# Patient Record
Sex: Female | Born: 1955 | Race: White | Hispanic: No | Smoking: Never smoker
Health system: Southern US, Community
[De-identification: ages and names within clinical notes are randomized; demographics above are authoritative.]

## PROBLEM LIST (undated history)

## (undated) ENCOUNTER — Emergency Department (HOSPITAL_COMMUNITY): Disposition: A | Discharge status: Home / Self Care | Payer: Commercial Managed Care - PPO

## (undated) DIAGNOSIS — B009 Herpesviral infection, unspecified: Secondary | ICD-10-CM

## (undated) DIAGNOSIS — C801 Malignant (primary) neoplasm, unspecified: Secondary | ICD-10-CM

## (undated) DIAGNOSIS — N390 Urinary tract infection, site not specified: Secondary | ICD-10-CM

## (undated) DIAGNOSIS — K219 Gastro-esophageal reflux disease without esophagitis: Secondary | ICD-10-CM

## (undated) DIAGNOSIS — C50919 Malignant neoplasm of unspecified site of unspecified female breast: Secondary | ICD-10-CM

## (undated) HISTORY — PX: BREAST LUMPECTOMY: SHX2

## (undated) HISTORY — DX: Herpesviral infection, unspecified: B00.9

## (undated) HISTORY — DX: Urinary tract infection, site not specified: N39.0

## (undated) HISTORY — DX: Gastro-esophageal reflux disease without esophagitis: K21.9

---

## 2003-03-11 ENCOUNTER — Ambulatory Visit (HOSPITAL_COMMUNITY): Admission: RE | Admit: 2003-03-11 | Discharge: 2003-03-11 | Payer: Self-pay | Admitting: Internal Medicine

## 2005-03-01 ENCOUNTER — Ambulatory Visit: Payer: Self-pay | Admitting: Internal Medicine

## 2005-08-13 ENCOUNTER — Emergency Department (HOSPITAL_COMMUNITY): Admission: EM | Admit: 2005-08-13 | Discharge: 2005-08-13 | Payer: Self-pay | Admitting: Emergency Medicine

## 2006-06-24 ENCOUNTER — Ambulatory Visit: Payer: Self-pay | Admitting: Internal Medicine

## 2008-12-27 DIAGNOSIS — C50919 Malignant neoplasm of unspecified site of unspecified female breast: Secondary | ICD-10-CM

## 2008-12-27 HISTORY — DX: Malignant neoplasm of unspecified site of unspecified female breast: C50.919

## 2008-12-27 HISTORY — PX: BREAST LUMPECTOMY: SHX2

## 2008-12-27 HISTORY — PX: BREAST SURGERY: SHX581

## 2009-02-20 ENCOUNTER — Encounter: Admission: RE | Admit: 2009-02-20 | Discharge: 2009-02-20 | Payer: Self-pay | Admitting: Obstetrics & Gynecology

## 2009-02-20 ENCOUNTER — Encounter (INDEPENDENT_AMBULATORY_CARE_PROVIDER_SITE_OTHER): Payer: Self-pay | Admitting: Obstetrics & Gynecology

## 2009-02-28 ENCOUNTER — Encounter: Admission: RE | Admit: 2009-02-28 | Discharge: 2009-02-28 | Payer: Self-pay | Admitting: Obstetrics & Gynecology

## 2009-03-07 ENCOUNTER — Encounter (INDEPENDENT_AMBULATORY_CARE_PROVIDER_SITE_OTHER): Payer: Self-pay | Admitting: Surgery

## 2009-03-07 ENCOUNTER — Ambulatory Visit (HOSPITAL_COMMUNITY): Admission: RE | Admit: 2009-03-07 | Discharge: 2009-03-08 | Payer: Self-pay | Admitting: Surgery

## 2009-03-07 ENCOUNTER — Encounter: Admission: RE | Admit: 2009-03-07 | Discharge: 2009-03-07 | Payer: Self-pay | Admitting: Surgery

## 2009-04-03 ENCOUNTER — Ambulatory Visit: Admission: RE | Admit: 2009-04-03 | Discharge: 2009-06-04 | Payer: Self-pay | Admitting: Radiation Oncology

## 2009-07-27 HISTORY — PX: ABDOMINAL HYSTERECTOMY: SHX81

## 2009-08-19 ENCOUNTER — Encounter (INDEPENDENT_AMBULATORY_CARE_PROVIDER_SITE_OTHER): Payer: Self-pay | Admitting: Obstetrics and Gynecology

## 2009-08-19 ENCOUNTER — Inpatient Hospital Stay (HOSPITAL_COMMUNITY): Admission: RE | Admit: 2009-08-19 | Discharge: 2009-08-20 | Payer: Self-pay | Admitting: Obstetrics and Gynecology

## 2010-03-04 ENCOUNTER — Encounter: Admission: RE | Admit: 2010-03-04 | Discharge: 2010-03-04 | Payer: Self-pay | Admitting: Hematology and Oncology

## 2011-01-16 ENCOUNTER — Other Ambulatory Visit: Payer: Self-pay | Admitting: Hematology and Oncology

## 2011-01-16 DIAGNOSIS — Z9889 Other specified postprocedural states: Secondary | ICD-10-CM

## 2011-01-17 ENCOUNTER — Encounter: Payer: Self-pay | Admitting: Obstetrics & Gynecology

## 2011-03-18 ENCOUNTER — Ambulatory Visit
Admission: RE | Admit: 2011-03-18 | Discharge: 2011-03-18 | Disposition: A | Discharge status: Home / Self Care | Payer: 59 | Source: Ambulatory Visit | Attending: Hematology and Oncology | Admitting: Hematology and Oncology

## 2011-03-18 DIAGNOSIS — Z9889 Other specified postprocedural states: Secondary | ICD-10-CM

## 2011-04-03 LAB — COMPREHENSIVE METABOLIC PANEL
ALT: 10 U/L (ref 0–35)
AST: 18 U/L (ref 0–37)
Albumin: 3.8 g/dL (ref 3.5–5.2)
Alkaline Phosphatase: 44 U/L (ref 39–117)
CO2: 31 mEq/L (ref 19–32)
Chloride: 102 mEq/L (ref 96–112)
Creatinine, Ser: 0.53 mg/dL (ref 0.4–1.2)
GFR calc Af Amer: >60 mL/min (ref >60)
GFR calc non Af Amer: >60 mL/min (ref >60)
Potassium: 3.8 mEq/L (ref 3.5–5.1)
Total Bilirubin: 0.9 mg/dL (ref 0.3–1.2)

## 2011-04-03 LAB — CBC
HCT: 30.8 % — ABNORMAL LOW (ref 36.0–46.0)
MCHC: 34.7 g/dL (ref 30.0–36.0)
MCV: 92.2 fL (ref 78.0–100.0)
MCV: 92.5 fL (ref 78.0–100.0)
Platelets: 126 10*3/uL — ABNORMAL LOW (ref 150–400)
Platelets: 163 10*3/uL (ref 150–400)
RBC: 4.1 MIL/uL (ref 3.87–5.11)
RDW: 13 % (ref 11.5–15.5)
WBC: 4.3 10*3/uL (ref 4.0–10.5)
WBC: 6 10*3/uL (ref 4.0–10.5)

## 2011-04-03 LAB — BASIC METABOLIC PANEL
BUN: 8 mg/dL (ref 6–23)
CO2: 31 mEq/L (ref 19–32)
Chloride: 104 mEq/L (ref 96–112)
Creatinine, Ser: 0.56 mg/dL (ref 0.4–1.2)
Glucose, Bld: 103 mg/dL — ABNORMAL HIGH (ref 70–99)
Potassium: 3.9 mEq/L (ref 3.5–5.1)

## 2011-04-03 LAB — APTT: aPTT: 23 seconds — ABNORMAL LOW (ref 24–37)

## 2011-04-08 LAB — CANCER ANTIGEN 27.29: CA 27.29: 6 U/mL (ref 0–39)

## 2011-04-08 LAB — DIFFERENTIAL
Lymphs Abs: 2.2 10*3/uL (ref 0.7–4.0)
Monocytes Absolute: 0.4 10*3/uL (ref 0.1–1.0)
Monocytes Relative: 7 % (ref 3–12)
Neutro Abs: 2.5 10*3/uL (ref 1.7–7.7)
Neutrophils Relative %: 49 % (ref 43–77)

## 2011-04-08 LAB — COMPREHENSIVE METABOLIC PANEL
ALT: 15 U/L (ref 0–35)
Albumin: 3.9 g/dL (ref 3.5–5.2)
Calcium: 9.5 mg/dL (ref 8.4–10.5)
Glucose, Bld: 76 mg/dL (ref 70–99)
Potassium: 4.1 mEq/L (ref 3.5–5.1)
Sodium: 144 mEq/L (ref 135–145)
Total Protein: 7.1 g/dL (ref 6.0–8.3)

## 2011-04-08 LAB — CBC
Hemoglobin: 12.7 g/dL (ref 12.0–15.0)
MCHC: 35 g/dL (ref 30.0–36.0)
Platelets: 181 10*3/uL (ref 150–400)
RDW: 12.5 % (ref 11.5–15.5)

## 2011-05-11 NOTE — Op Note (Signed)
NAMETIFANNY, [name]             ACCOUNT NO.:  [id number]   MEDICAL RECORD NO.:  [id number]          PATIENT TYPE:  OIB   LOCATION:  5125                         FACILITY:  [facility]   PHYSICIAN:  Sandria Bales. Ezzard Standing, M.D.  DATE OF BIRTH:  1956-05-15   DATE OF PROCEDURE:  03/07/2009  DATE OF DISCHARGE:                               OPERATIVE REPORT   Date of Surgery ?   PREOPERATIVE DIAGNOSIS:  Right breast cancer at 11 o'clock position,  approximate 7-mm (T1a).   POSTOPERATIVE DIAGNOSES:  1. Right breast cancer, 11 o'clock position essentially in the right      axillary tail of Spence, approximately 7 mm in size.  2. Sentinel lymph node biopsy.   PROCEDURES:  Injection of methylene blue, right needle-localized  lumpectomy, right axillary node dissection with marking of sentinel  lymph node.   SURGEON:  Sandria Bales. Ezzard Standing, MD   ASSISTANT:  None.   ANESTHESIA:  General endotracheal.   ESTIMATED BLOOD LOSS:  50 cc.   DRAINS LEFT IN:  None.   INDICATION FOR PROCEDURE:  [name] is a 55 year old white female,  patient of Dr. Varney Baas, who had recent abnormalities found on  screening mammography, biopsy that showed an infiltrating ductal  carcinoma.  I discussed with her about proceeding with lumpectomy and  sentinel lymph node biopsy.  She now comes for attempt at this.   The potential complications include but are not limited to bleeding,  infection, nerve injury, and recurrence of the tumor as well as axillary  node dissection.  She understands and we tried to do a sentinel lymph  node first, if that is negative stop but if it is positive, possibly do  an axillary node dissection, and the risks of that include nerve injury  and lymphedema.   OPERATION:  The patient was placed in supine position.  A roll was placed in the  right arm with the right arm propped up some, andher right breast and  prepped with Betadine solution, and time-out held.   Her breast had been  injected with 1 mCi of sulfur colloid  preoperatively.  I injected about 2 cc of 40% methylene blue in the  subareolar portion.  I tried to find a sentinel node preoperatively but  never found anything by the Neoprobe.   After prepping out her breast, I then used the wire, which was coming  really lateral posteriorly, it was really in the low axilla, as the  posterior point.  I then excised the block of breast tissue  approximately 5 cm in diameter.  I went down to the chest wall.  I  thought I got well around the wire.   I sent this as a specimen mammogram, which confirmed that the wire, the  tumor, the clip were all in the specimen.  I used the ink kit to mark  the margins of this lumpectomy.   I then turned my attention to sentinel lymph node, but I really never  found any counts.  I went on and did a lower axillary dissection.  After  having the nodes about two-thirds  of the way down, I was able to find at  least 1 node that had some very faint counts, was faintly blue, but I  went on and did the axillary dissection then.  I identified the inferior  aspect of the axillary vein.  I found the long thoracic nerve along the  chest wall and the latissimus dorsi posteriorly with the thoracodorsal  nerve were spared this during the dissection.  There was this little wad  of nodes, again part of those nodes which was hot on the Neoprobe, it  had some faint bluish color.   I spoke with Dr. Nedra Hai about my findings, that basically this was probably  more of a lymph node dissection than a sentinel lymph node biopsy, but  since I had done this dissection, there is no reason for touch prep and  she will just put that through for permanent evaluation.   I then irrigated the wound.  Because of the wider dissection of the  axilla, I did leave a 19-French Blake drain in the wound.  I sewed this  in place with a 2-0 nylon suture.  I sewed subcutaneous tissue with a 3-  0 Vicryl suture, the skin with  a 5-0 Monocryl suture, and painted the  wound with tincture of benzoin, and steri-stripped it.   The patient tolerated the procedure well, and was transported to the  recovery room in good condition.  Sponge and needle counts were correct  at the end of case.       Sandria Bales. Ezzard Standing, M.D.  Electronically Signed     DHN/MEDQ  D:  03/07/2009  T:  03/08/2009  Job:  [id number]   cc:   Freddy Finner, M.D.  Dr. Winferd Humphrey

## 2011-05-11 NOTE — Op Note (Signed)
NAMEGRACIANA, SESSA NO.:  [id number]   MEDICAL RECORD NO.:  [id number]          PATIENT TYPE:  INP   LOCATION:  9312                          FACILITY:  [facility]   PHYSICIAN:  Randye Lobo, M.D.   DATE OF BIRTH:  [birth year]   DATE OF PROCEDURE:  08/19/2009  DATE OF DISCHARGE:                               OPERATIVE REPORT   PREOPERATIVE DIAGNOSES:  1. Incomplete uterovaginal prolapse.  2. Occult genuine stress incontinence.  3. 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   POSTOPERATIVE DIAGNOSES:  1. Incomplete uterovaginal prolapse.  2. Occult genuine stress incontinence.  3. 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   PROCEDURE:  A laparoscopically-assisted vaginal hysterectomy with  bilateral salpingo-oophorectomy, anterior and posterior colporrhaphy,  TVT, mid urethral sling, cystoscopy.   SURGEON:  Randye Lobo, MD   ASSISTANT:  Luvenia Redden, MD   ANESTHESIA:  General endotracheal, local with 0.5% lidocaine with  epinephrine 1:200,000 and local with 0.25% Marcaine.   IV FLUIDS:  1750 mL of Ringer lactate.   ESTIMATED BLOOD LOSS:  200 mL.   URINE OUTPUT:  600 mL.   COMPLICATIONS:  None.   INDICATIONS FOR THE PROCEDURE:  The patient is a 55 year old, para 2,  Caucasian female, status post bilateral tubal ligation who presented  with vaginal protrusion and a desire for surgical treatment.  During the  patient's evaluation, she underwent urodynamic testing with reduction of  her third-degree cystocele, at which time she was diagnosed with occult  genuine stress incontinence.  The patient has recently had a diagnosis  of adenocarcinoma of the right breast which is both estrogen and  progesterone receptor positive.  She had a lumpectomy with sentinel node  evaluation and radiation therapy, and she is currently on tamoxifen  treatment  under the direction of Dr. Isabel Caprice.  A plan is now made  to proceed with a laparoscopically-assisted vaginal hysterectomy with  bilateral salpingo-oophorectomy, anterior and posterior colporrhaphy  along with a mid urethral sling and cystoscopy.  Risks, benefits, and  alternatives have been discussed with the patient who wishes to proceed.   FINDINGS:  Examination under anesthesia revealed a third-degree  cystocele with first-degree uterine prolapse and a second-degree  rectocele.  At the time of laparoscopy, the uterus and ovaries were  within normal limits.  The fallopian tubes were consistent with a prior  bilateral tubal ligation.  The patient had several vessels in the  infundibulopelvic region and along the pelvic sidewalls which appeared  to be consistent with a diagnosis of pelvic congestion syndrome.  The  appendix was normal along with the liver.  There was no evidence of any  adhesive disease nor excrescences throughout the abdomen or pelvis.   SPECIMEN:  The uterus, tubes, and ovaries were sent to pathology.   PROCEDURE:  The patient was reidentified in the preoperative hold area.  She did receive cefotetan IV for antibiotic prophylaxis and she received  both PAS stockings and TED hose for DVT prophylaxis.  In the operating room, general endotracheal anesthesia was induced, and  the patient was placed in the dorsal lithotomy position.  The abdomen  and the vagina were sterilely prepped and draped.  A Foley catheter was  placed inside the bladder.  A speculum was placed inside the vagina, and  a single-tooth tenaculum was placed on the anterior cervical lip.  This  was replaced with a Hulka tenaculum, and the remaining vaginal  instruments were removed.   Attention was then turned to the abdomen where a 1-cm umbilical incision  was created sharply with a scalpel.  The dissection along the abdominal  wall was carried down to the fascia using an Allis clamp.  A 10-mm   trocar was then inserted directly into the peritoneal cavity without  difficulty.  The laparoscope confirmed proper placement.  A CO2  pneumoperitoneum was achieved, and the patient was then placed in the  Trendelenburg position.  A 5-mm incision was created in the left lower  quadrant, and a trocar placed under direct visualization of the  laparoscope.  An inspection of the pelvic and abdominal organs was  performed, and the findings are as noted above.  A 5-mm right lower  quadrant incision was then created and again a 5-mm trocar placed under  visualization of the laparoscope.   The procedure was begun by taking down some filmy adhesions of the  sigmoid colon over the right infundibulopelvic ligament.  The ureter was  then identified along the left pelvic sidewall.  The Gyrus instrument  was used to triply cauterize and then cut the infundibulopelvic  ligament.  Dissection was carried through the posterior leaf of the  broad ligament using again the Gyrus instrument for coagulation and  cutting.  The left round ligament was grasped and was similarly  cauterized and bisected.  Dissection continued through the anterior leaf  of the broad ligament again using the same instrument.  A branch of the  uterine artery superiorly was encountered during this dissection and  hemostasis was achieved with the Gyrus instrument.  The same procedure  that was performed on the patient's left-hand side was repeated on the  right-hand side after the right ureter was identified.  The dissection  on the right-hand side was not taken down to the level of any uterine  artery structures.  Hemostasis was good at this time, and the remainder  of the procedure was therefore performed vaginally.  The laparoscope and  laparoscopic instruments were removed.   The patient was then placed in the high lithotomy position.  A weighted  speculum was placed inside the vagina.  The cervix was grasped with  tenaculums  anteriorly and posteriorly.  The cervix was circumferentially  injected with 0.5% lidocaine with epinephrine 1:200,000.  The cervix was  circumscribed with the scalpel.  Entry into the anterior cul-de-sac was  performed sharply and digital exam confirmed this to be in the proper  location.  The posterior cul-de-sac was then entered sharply, and again  digital exam confirmed proper entry into this location.  A Deaver was  placed in the anterior cul-de-sac and a long weighted speculum in the  posterior cul-de-sac.   The uterosacral ligaments were then grasped with Heaney clamps, sharply  divided, and suture ligated with transfixing sutures of 0 Vicryl.  The  bladder pillars were then grasped with the Heaney clamp, sharply  divided, and then suture ligated with 0 Vicryl bilaterally as well.  Dissection then continued along the cardinal ligaments which  were  sequentially clamped, sharply divided, and suture ligated with 0 Vicryl.  This dissection was then carried to the top of the cardinal ligaments  using the same technique until the pedicles had been completely taken  all the way up to the previous cautery line.  The uterus, tubes, and  ovaries were removed at this time and sent to pathology.   Pedicles were examined and found to be hemostatic.  A short weighted  speculum was then placed inside the vagina and the posterior vaginal  cuff was then whip stitched with a running locked suture of 0 Vicryl.   A culdoplasty suture was next placed by going through the vagina at the  6 o'clock position and into the posterior cul-de-sac.  It was brought  through the distal left uterosacral ligament, across the posterior cul-  de-sac in a pursestring fashion, and then down through the distal right  uterosacral ligament before coming out through the cul-de-sac and into  the vagina again at the 6 o'clock position.   The anterior colporrhaphy was performed next.  Allis clamps were used to  mark the  midline of the anterior vaginal mucosa which was injected with  0.5% Lidocaine with epinephrine 1:200,000.  The anterior vaginal mucosa  was then bisected in the midline using a Metzenbaum scissors.  Sharp  dissection along with blunt dissection was used then to dissect the  bladder and subvaginal tissue from the overlying vaginal mucosa.  Thus,  the dissection was carried back to the level of the pubic rami and the  uterosacral ligaments inferiorly.   The sling was performed next.  It was performed in a top-down fashion.  A 5-mm incisions were created 2 cm to the right and left of the midline  suprapubically.  The abdominal needle passer was placed through the  right suprapubic incision and out through the endopelvic fascia at the  level of the mid urethra and lateral to this on the ipsilateral side.  The same procedure that was performed on the right was then repeated on  the left-hand side.  The Foley catheter was removed at this time and  cystoscopy was performed.  There was no evidence of a foreign body in  the bladder or the urethra.  The bladder was visualized throughout 360  degrees and had a normal bladder dome and trigone.  The ureters were  noted to be patent bilaterally.  The cystoscopic fluid was drained, and  the Foley catheter was replaced.  The sling was attached to the  abdominal needle passers and drawn up through the incisions  suprapubically.  The plastic was separated from the surrounding sling,  and a Kelly clamp was placed between the sling and the urethra as the  plastic sheaths were removed.  The sling was noted to be in good  position.  Excess sling was trimmed suprapubically.   The anterior colporrhaphy was performed next by placing vertical  mattress sutures of 2-0 Vicryl for reduction of the cystocele.  Excess  mucosa was then trimmed anteriorly and the anterior vaginal wall was  closed with a running locked suture of 2-0 Vicryl.  The vaginal cuff was   closed at this time with a running suture of 0 Vicryl.   The posterior colporrhaphy was performed next.  Allis clamps were used  to mark the posterior mucosa in the midline.  The perineal body was  noted to be quite short.  The vaginal mucosa was first incised  transversely and then vertically  in the midline with a Metzenbaum  scissors.  Sharp dissection was used to dissect the perirectal fascia  off the overlying vaginal mucosa.  This was performed to a level of  approximately 1 cm below the culdoplasty suture.  Hemostasis during the  dissection was performed with monopolar cautery.  The rectocele was  reduced by placing vertical mattress sutures of 0 Vicryl.  A vertical  mattress suture of 0 Vicryl was placed at the level of the perineal  body.  Excess mucosa was trimmed, and the posterior vaginal mucosa was  closed with a running locked suture of 2-0 Vicryl which continued along  the perineal body in a subcuticular fashion as for an episiotomy-type  repair.   The rectal exam was performed which confirmed the absence of sutures in  the rectum.  A plain gauze packing was placed inside the vagina.  The  Foley catheter was left to gravity drainage.  The Foley catheter had  been replaced after the cystoscopy had been performed.   The suprapubic incisions were closed with Dermabond.   Final laparoscopy was performed.  The pelvis was irrigated and  suctioned, and all pedicle sites and vaginal cuff were noted to be  hemostatic.  The 5-mm trocars were removed under visualization of the  laparoscope.  The CO2 pneumoperitoneum was released, and the umbilical  trocar and laparoscope were removed simultaneously.   The umbilical incision was closed with inverted subcuticular sutures of  4-0 Vicryl.  All of the abdominal incisions were then closed with  Dermabond.   This concluded the patient's procedure.  There were no complications.  All needle, instrument, and sponge counts were  correct.      Randye Lobo, M.D.  Electronically Signed     BES/MEDQ  D:  08/19/2009  T:  08/20/2009  Job:  [id number]   cc:   Lynett Fish, M.D.

## 2011-05-11 NOTE — H&P (Signed)
NAMESTEPHENI, [name] NO.:  [id number]   MEDICAL RECORD NO.:  [id number]          PATIENT TYPE:  AMB   LOCATION:  SDC                           FACILITY:  [facility]   PHYSICIAN:  Randye Lobo, M.D.   DATE OF BIRTH:  [birth year]   DATE OF ADMISSION:  DATE OF DISCHARGE:                              HISTORY & PHYSICAL   CHIEF COMPLAINT:  Vaginal prolapse.   HISTORY OF PRESENT ILLNESS:  The patient is a 55 year old gravida 2,  para 2 Caucasian female status post bilateral tubal ligation, who  presents with a vaginal bulge and a desire for surgical treatment.  The  patient has recently been diagnosed with adenocarcinoma of the right  breast and is status post lumpectomy with radiation therapy for an  estrogen receptor positive and progesterone receptor positive tumor.  The patient had no evidence of nodal disease.  She is currently on  tamoxifen therapy.   During the patient's evaluation in the office, she was noted to have  pelvic organ prolapse, and she underwent urodynamic testing with  reduction of her prolapse, which confirmed the presence of occult  genuine stress incontinence.  The patient reports the need for perineal  splinting to have bowel movements if she does not regularly use flax  seed oil.   The patient now desires surgical treatment of both her prolapse and her  incontinence.   PAST OBSTETRIC AND GYNECOLOGIC HISTORIES:  1. Status post vaginal delivery x2.  2. Status post bilateral tubal ligation in 1990.  3. Status post right breast lumpectomy with sentinel node procedure on      August 07, 2009.  4. Last Pap smear performed January 2010 was within normal limits.   PAST MEDICAL HISTORY:  1. Adenocarcinoma of the right breast, estrogen and progesterone      receptor positive.  Status post lumpectomy with sentinel node      procedure and radiation therapy.  Current tamoxifen treatment.  2. Gastroesophageal reflux disease.  The patient is currently  on      omeprazole.  3. History of urinary tract infection.  The patient reports that she      has undergone cystoscopy with a urologist in [place], and states that      this was normal.  4. Osteopenia.   SURGICAL HISTORY:  1. Status post bilateral tubal ligation.  2. Status post right breast lumpectomy with sentinel node procedure      March 07, 2009.  3. Status post knee surgery for cartilage removal 37 years ago.   CURRENT MEDICATIONS:  1. Tamoxifen 40 mg p.o. daily.  2. Omeprazole 40 mg p.o. daily.  3. Calcium.   ALLERGIES:  No known drug allergies.   SOCIAL HISTORY:  The patient is not married.  She has 2 children.  She  works as a Occupational hygienist at [employer].  She denies  use of tobacco, alcohol, or illicit drugs.   FAMILY HISTORY:  Positive for breast cancer in the patient's mother.   REVIEW OF SYSTEMS:  Please refer to the history of present illness.   PHYSICAL EXAMINATION:  VITAL SIGNS:  Height is 5 feet 7 inches, weight  148 pounds, blood pressure 110/82.  GENERAL:  The patient is a middle-aged Caucasian female in no acute  distress.  HEENT:  Normocephalic, atraumatic.  There is no evidence of thyromegaly  nor supraclavicular or cervical lymphadenopathy.  LUNGS:  Clear to auscultation bilaterally.  HEART:  S1, S2 with a regular rate and rhythm.  ABDOMEN:  Soft and nontender without evidence of hepatosplenomegaly or  organomegaly.  PELVIC:  Normal external genitalia and urethra.  The vagina and cervix  demonstrate no lesions.  There is a second to third degree cystocele,  first degree uterine prolapse, and a first to second degree rectocele.  On bimanual examination, the uterus is small and nontender with no  evidence of adnexal masses or tenderness.  RECTAL:  The anus is without lesions.   IMPRESSION:  The patient is a 55 year old para 2 Caucasian female with a  recent history of estrogen and progesterone receptor positive right  breast cancer.   The patient is currently on tamoxifen.  The patient has  both occult genuine stress incontinence and incomplete uterovaginal  prolapse.   PLAN:  The patient will undergo a laparoscopically assisted hysterectomy  with bilateral salpingo-oophorectomy and anterior and posterior  colporrhaphy, and tension-free vaginal tape, midurethral sling and  cystoscopy at the [facility] on August 19, 2009.  Risks, benefits, and alternatives have been discussed with the patient  who wishes to proceed.      Randye Lobo, M.D.  Electronically Signed     BES/MEDQ  D:  08/18/2009  T:  08/18/2009  Job:  [id number]

## 2011-05-14 NOTE — H&P (Signed)
NAME:  [name], [name] NO.:  [id number]   MEDICAL RECORD NO.:  [id number]                  PATIENT TYPE:   LOCATION:                                       FACILITY:   PHYSICIAN:  Lionel December, M.D.                 DATE OF BIRTH:  Apr 28, 1956   DATE OF ADMISSION:  DATE OF DISCHARGE:                                HISTORY & PHYSICAL   PRESENTING COMPLAINT:  Acid reflux and bloating.   HISTORY OF PRESENT ILLNESS:  [name] is a 55 year old Caucasian female patient  of Dr. Eliberto Ivory who was self-referred for GI evaluation.  She has been having  problems with intermittent burning in her throat, as well as chest pain.  Symptoms are gradually getting worse.  About two months ago she started  Prilosec OTC.  Initially she had a good result, but this only lasted for two  weeks.  Now she may be 50% better.  She also gives a history of choking on  drinking water.  She has changed her eating habits.  She also sleeps on  pillows, but has not had what she would consider satisfactory response.  She  also complains of epigastric pain and bloating with certain foods.  She has  had problems with constipation, but lately her bowels have been moving  daily.  She denies dysphagia with solids, hoarseness, chronic cough, melena,  or rectal bleeding.  She has had some nausea.  She says her appetite is  good, and her weight over the last six months has been stable.  However,  prior to that she lost 55 pounds over a period of two years, which she feels  was all voluntary weight loss.  She says Dr. Eliberto Ivory is a family physician,  but she has not seen him in a few years, but yet she is getting her pelvic  and Pap and mammography through Dr. Duanne Moron office.  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   MEDICATIONS:  1. She is on Prilosec OTC 20 mg q.a.m.,  2. M.V.I. daily.  3. Citracal+D daily.   She is also interested in  getting a bone density to make sure she does not  have osteoporosis.  Her mother had significant problems with osteoporosis.   PAST MEDICAL HISTORY:  Negative.  She has never been hospitalized, and has  not had any surgeries.   ALLERGIES:  No known allergies.   FAMILY HISTORY:  Father was diagnosed with lung carcinoma at age 57 and died  in less than a year.  Mother was diabetic and asthmatic.  She died at age  37.  She has three brothers in good health.   SOCIAL HISTORY:  She is a widow.  She has two sons.  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  She has  never smoked cigarettes, and does not drink alcohol.   PHYSICAL EXAMINATION:  GENERAL:  A pleasant, well-developed, thin Caucasian  female who in no acute distress.  VITAL SIGNS:  She weighs 136.5 pounds.  She is 5 feet 7 inches tall.  Pulse  60 per minute, blood pressure 110/70, temperature 97.6.  HEENT:  Conjunctivae are pink.  Sclerae is nonicteric.  Oropharyngeal mucosa  is normal.  Dentition is in satisfactory condition.  NECK:  Without masses or thyromegaly.  CARDIAC:  Regular rhythm.  Normal S1 and S2.  No murmur or gallop noted.  LUNGS:  Clear to auscultation.  ABDOMEN:  Flat.  It is soft with mild midepigastric tenderness.  No  organomegaly or masses noted.  RECTAL:  Deferred.  EXTREMITIES:  She does not have edema or clubbing.   LABORATORY DATA:  Provided by the patient performed on 10/31/02.  Her CBC is  normal.  Hemoglobin and hematocrit is 13.2 and 39.9.  TSH is 1.095.  Chemistry panel is normal except for bilirubin of 1.7, which was not  fractionated.  AST 16, ALT 12, total cholesterol 201, HDL 81, LDL 106.   ASSESSMENT:  [name] is a 55 year old Caucasian female with a 6- to 52-month  history of symptoms of reflux esophagitis, mainly in the form of burning in  her chest and throat, who has had modest improvement with OTC Prilosec which  she has  taken for two months.  She also has epigastric pain and some  bloating.  I feel all of her symptoms could be explained best as reflux  esophagitis.  Given rather late onset of symptoms, need to proceed with  esophagogastroduodenoscopy to make sure she does not have peptic ulcer  disease or a large hernia.   RECOMMENDATIONS:  1. Anti-reflex measures reinforced.  2. Will ask her to discontinue Prilosec and try her on Nexium 40 mg q.a.m.  3. Diagnostic esophagogastroduodenoscopy to be performed at [facility] in the near future.  Will also proceed with a bone density study     because of family history.                                               Lionel December, M.D.    NR/MEDQ  D:  02/13/2003  T:  02/13/2003  Job:  [id number]   cc:   Weyman Pedro, M.D.  [place], [place]

## 2011-05-14 NOTE — Op Note (Signed)
NAME:  [name], [name]                       ACCOUNT NO.:  [id number]   MEDICAL RECORD NO.:  [id number]                   PATIENT TYPE:  AMB   LOCATION:  DAY                                  FACILITY:  [facility]   PHYSICIAN:  Lionel December, M.D.                 DATE OF BIRTH:  08/10/1956   DATE OF PROCEDURE:  03/11/2003  DATE OF DISCHARGE:                                 OPERATIVE REPORT   PROCEDURE:  Esophagogastroduodenoscopy.   INDICATIONS:  The patient is a 55 year old Caucasian female with symptoms of  reflux esophagitis, who has not responded to Prilosec, and she __________.  She is undergoing EGD to make sure she does not have complicated GERD.  The  procedure is reviewed with the patient and informed consent was obtained.   PREMEDICATION:  Cetacaine spray for pharyngeal topical anesthesia, Demerol  50 mg IV, Versed 5 mg IV in divided dose.   INSTRUMENT USED:  Olympus video system.   FINDINGS:  Procedure performed in endoscopy suite.  The patient was placed  in the left lateral recumbent position and the endoscope was passed via the  oropharynx without any difficulty into esophagus.   Esophagus:  Mucosa of the esophagus was normal except she had an island of  gastric-type mucosa above the squamocolumnar junction.  This was about 12-15  mm long and less than a centimeter wide.  It was at 6 o'clock.  A picture  was taken for the record.  There was a small sliding hiatal hernia.   Stomach:  It was empty and distended very well with insufflation.  Folds of  the proximal stomach were normal.  Examination of the mucosa at gastric  body, antrum, pyloric channel, as well as angularis, fundus, and cardia, was  normal.   Duodenum:  Examination of the bulb, second and third part of the duodenum  was also normal.   Endoscope was withdrawn.  The patient tolerated the procedure well.   FINAL DIAGNOSES:  1. Small sliding hiatal hernia.  2. About a 10 x 15 mm patch of gastric-type  mucosa above GE junction.  This     was not enough to be labeled Barrett's esophagus, but she needs to be     reassessed at some point in the future.   RECOMMENDATIONS:  1. She will continue antireflux measures as before.  2. Nexium 40 mg p.o. q.a.m., prescription given for a month with five     refills.  3.     She will return for an office visit in three months from now.  4. I would also recommend bringing her back for an EGD in five years to make     sure she has not developed full-blown Barrett's esophagus.  Lionel December, M.D.    NR/MEDQ  D:  03/11/2003  T:  03/11/2003  Job:  [id number]   cc:   Dr. Eliberto Ivory

## 2011-05-27 ENCOUNTER — Encounter (INDEPENDENT_AMBULATORY_CARE_PROVIDER_SITE_OTHER): Payer: Self-pay | Admitting: Surgery

## 2011-07-22 ENCOUNTER — Ambulatory Visit: Admit: 2011-07-22 | Payer: Self-pay | Admitting: Internal Medicine

## 2011-07-22 ENCOUNTER — Encounter (INDEPENDENT_AMBULATORY_CARE_PROVIDER_SITE_OTHER): Payer: 59 | Admitting: Internal Medicine

## 2011-07-22 SURGERY — COLONOSCOPY
Anesthesia: Moderate Sedation

## 2011-09-20 ENCOUNTER — Other Ambulatory Visit: Payer: Self-pay | Admitting: Hematology and Oncology

## 2011-09-20 DIAGNOSIS — C50919 Malignant neoplasm of unspecified site of unspecified female breast: Secondary | ICD-10-CM

## 2011-10-25 ENCOUNTER — Encounter (INDEPENDENT_AMBULATORY_CARE_PROVIDER_SITE_OTHER): Payer: Self-pay | Admitting: Surgery

## 2012-03-23 ENCOUNTER — Ambulatory Visit
Admission: RE | Admit: 2012-03-23 | Discharge: 2012-03-23 | Disposition: A | Discharge status: Home / Self Care | Payer: BC Managed Care – PPO | Source: Ambulatory Visit | Attending: Hematology and Oncology | Admitting: Hematology and Oncology

## 2012-03-23 DIAGNOSIS — C50919 Malignant neoplasm of unspecified site of unspecified female breast: Secondary | ICD-10-CM

## 2012-07-03 ENCOUNTER — Ambulatory Visit (INDEPENDENT_AMBULATORY_CARE_PROVIDER_SITE_OTHER): Payer: BC Managed Care – PPO | Admitting: Internal Medicine

## 2012-09-01 ENCOUNTER — Encounter: Payer: BC Managed Care – PPO | Admitting: Hematology and Oncology

## 2012-09-27 ENCOUNTER — Other Ambulatory Visit (HOSPITAL_COMMUNITY): Payer: Self-pay | Admitting: Internal Medicine

## 2012-09-27 ENCOUNTER — Other Ambulatory Visit: Payer: Self-pay | Admitting: Hematology and Oncology

## 2012-09-27 DIAGNOSIS — Z9889 Other specified postprocedural states: Secondary | ICD-10-CM

## 2012-09-27 DIAGNOSIS — Z853 Personal history of malignant neoplasm of breast: Secondary | ICD-10-CM

## 2013-03-26 ENCOUNTER — Ambulatory Visit
Admission: RE | Admit: 2013-03-26 | Discharge: 2013-03-26 | Disposition: A | Discharge status: Home / Self Care | Payer: BC Managed Care – PPO | Source: Ambulatory Visit | Attending: Internal Medicine | Admitting: Internal Medicine

## 2013-03-26 DIAGNOSIS — Z9889 Other specified postprocedural states: Secondary | ICD-10-CM

## 2013-03-26 DIAGNOSIS — Z853 Personal history of malignant neoplasm of breast: Secondary | ICD-10-CM

## 2013-04-05 DIAGNOSIS — C50919 Malignant neoplasm of unspecified site of unspecified female breast: Secondary | ICD-10-CM

## 2013-04-05 DIAGNOSIS — M899 Disorder of bone, unspecified: Secondary | ICD-10-CM

## 2013-04-05 DIAGNOSIS — M949 Disorder of cartilage, unspecified: Secondary | ICD-10-CM

## 2013-05-02 ENCOUNTER — Encounter (INDEPENDENT_AMBULATORY_CARE_PROVIDER_SITE_OTHER): Payer: Self-pay

## 2013-06-19 ENCOUNTER — Other Ambulatory Visit (INDEPENDENT_AMBULATORY_CARE_PROVIDER_SITE_OTHER): Payer: Self-pay | Admitting: Internal Medicine

## 2013-06-19 DIAGNOSIS — K219 Gastro-esophageal reflux disease without esophagitis: Secondary | ICD-10-CM

## 2013-06-19 MED ORDER — OMEPRAZOLE 40 MG PO CPDR: 40.0000 mg | DELAYED_RELEASE_CAPSULE | Freq: Every day | ORAL | Status: DC

## 2013-06-19 NOTE — Progress Notes (Signed)
Rx for Omeprazole sent to her pharmacy. [name] will schedule her an office visit.

## 2013-06-23 ENCOUNTER — Other Ambulatory Visit (INDEPENDENT_AMBULATORY_CARE_PROVIDER_SITE_OTHER): Payer: Self-pay | Admitting: Internal Medicine

## 2013-10-01 ENCOUNTER — Ambulatory Visit (INDEPENDENT_AMBULATORY_CARE_PROVIDER_SITE_OTHER): Payer: BC Managed Care – PPO | Admitting: Internal Medicine

## 2013-12-28 ENCOUNTER — Other Ambulatory Visit (HOSPITAL_COMMUNITY): Payer: Self-pay | Admitting: Internal Medicine

## 2014-01-01 ENCOUNTER — Other Ambulatory Visit (HOSPITAL_COMMUNITY): Payer: Self-pay | Admitting: Internal Medicine

## 2014-01-01 DIAGNOSIS — Z853 Personal history of malignant neoplasm of breast: Secondary | ICD-10-CM

## 2014-03-29 ENCOUNTER — Ambulatory Visit
Admission: RE | Admit: 2014-03-29 | Discharge: 2014-03-29 | Disposition: A | Discharge status: Home / Self Care | Payer: BC Managed Care – PPO | Source: Ambulatory Visit | Attending: Internal Medicine | Admitting: Internal Medicine

## 2014-03-29 DIAGNOSIS — Z853 Personal history of malignant neoplasm of breast: Secondary | ICD-10-CM

## 2014-06-04 ENCOUNTER — Ambulatory Visit (INDEPENDENT_AMBULATORY_CARE_PROVIDER_SITE_OTHER): Payer: BC Managed Care – PPO | Admitting: Internal Medicine

## 2014-06-04 ENCOUNTER — Encounter (INDEPENDENT_AMBULATORY_CARE_PROVIDER_SITE_OTHER): Payer: Self-pay | Admitting: Internal Medicine

## 2014-06-04 VITALS — BP 112/78 | HR 60 | Temp 98.4°F | Ht 67.0 in | Wt 156.6 lb

## 2014-06-04 DIAGNOSIS — K219 Gastro-esophageal reflux disease without esophagitis: Secondary | ICD-10-CM

## 2014-06-04 MED ORDER — OMEPRAZOLE 40 MG PO CPDR: 40.0000 mg | DELAYED_RELEASE_CAPSULE | Freq: Every day | ORAL | Status: DC

## 2014-06-04 NOTE — Progress Notes (Signed)
Subjective:     Patient ID: [name], female   DOB: 01-18-1956, 58 y.o.   MRN: [id number]  HPI Here today for f/u of her GERD. Her GERD is controlled with the Omeprazole and diet. Appetite is good. No weight loss. BMs are normal. No melena or bright red rectal bleeding. She has never undergone a colonoscopy in the past.  No GI symptoms.   03/11/2003 EGD: FINAL DIAGNOSES:  1. Small sliding hiatal hernia.  2. About a 10 x 15 mm patch of gastric-type mucosa above GE junction. This  was not enough to be labeled Barrett's esophagus, but she needs to be  reassessed at some point in the future.    Review of Systems Past Medical History  Diagnosis Date  . GERD (gastroesophageal reflux disease)     Past Surgical History  Procedure Laterality Date  . Abdominal hysterectomy  August 2010  . Breast surgery  2010    Breast cancer    No Known Allergies  Current Outpatient Prescriptions on File Prior to Visit  Medication Sig Dispense Refill  . Calcium-Phosphorus-Vitamin D (CITRACAL CALCIUM GUMMIES PO) Take by mouth daily. 2 tablet daily       . Omega-3 Fatty Acids (FISH OIL PO) Take by mouth. 1 tablet       . omeprazole (PRILOSEC) 40 MG capsule TAKE ONE CAPSULE 30 MINS BEFORE BREAKFAST  30 capsule  11   No current facility-administered medications on file prior to visit.   Widowed, two children in good health     Objective:   Physical Exam  Filed Vitals:   06/04/14 1557  BP: 112/78  Pulse: 60  Temp: 98.4 F (36.9 C)  Height: 5\' 7"  (1.702 m)  Weight: 156 lb 9.6 oz (71.033 kg)  Alert and oriented. Skin warm and dry. Oral mucosa is moist.   . Sclera anicteric, conjunctivae is pink. Thyroid not enlarged. No cervical lymphadenopathy. Lungs clear. Heart regular rate and rhythm.  Abdomen is soft. Bowel sounds are positive. No hepatomegaly. No abdominal masses felt. No tenderness.  No edema to lower extremities.       Assessment:     GERD controlled at this time. No GI  complaints.    Plan:    Rx for Omeprazole 40mg  daily with 11 refills. OV in one year. Will place on a recall for a colonoscopy. She is overdue.

## 2014-06-04 NOTE — Patient Instructions (Signed)
Rx for Omeprazole eprescribed to her pharmacy. Will place on recall for colonoscopy next May

## 2014-09-05 ENCOUNTER — Other Ambulatory Visit (INDEPENDENT_AMBULATORY_CARE_PROVIDER_SITE_OTHER): Payer: Self-pay | Admitting: *Deleted

## 2014-09-05 DIAGNOSIS — Z1211 Encounter for screening for malignant neoplasm of colon: Secondary | ICD-10-CM

## 2014-09-27 ENCOUNTER — Telehealth (INDEPENDENT_AMBULATORY_CARE_PROVIDER_SITE_OTHER): Payer: Self-pay | Admitting: *Deleted

## 2014-09-27 DIAGNOSIS — Z1211 Encounter for screening for malignant neoplasm of colon: Secondary | ICD-10-CM

## 2014-09-27 NOTE — Telephone Encounter (Signed)
Patient needs movi prep 

## 2014-09-30 MED ORDER — PEG-KCL-NACL-NASULF-NA ASC-C 100 G PO SOLR: 1.0000 | Freq: Once | ORAL | Status: DC

## 2014-10-16 ENCOUNTER — Telehealth (INDEPENDENT_AMBULATORY_CARE_PROVIDER_SITE_OTHER): Payer: Self-pay | Admitting: *Deleted

## 2014-10-16 NOTE — Telephone Encounter (Signed)
Referring MD/PCP: patient states no PCP   Procedure: tcs  Reason/Indication:  screening  Has patient had this procedure before?  no  If so, when, by whom and where?    Is there a family history of colon cancer?  no  Who?  What age when diagnosed?    Is patient diabetic?   no      Does patient have prosthetic heart valve?  no  Do you have a pacemaker?  no  Has patient ever had endocarditis? no  Has patient had joint replacement within last 12 months?  no  Does patient tend to be constipated or take laxatives? no  Is patient on Coumadin, Plavix and/or Aspirin? no  Medications: see epic  Allergies: nkda  Medication Adjustment:   Procedure date & time: 11/07/14 at 1200

## 2014-10-18 NOTE — Telephone Encounter (Signed)
agree

## 2014-10-23 ENCOUNTER — Encounter (HOSPITAL_COMMUNITY): Payer: Self-pay | Admitting: Pharmacy Technician

## 2014-11-07 ENCOUNTER — Encounter (HOSPITAL_COMMUNITY)
Admission: RE | Disposition: A | Discharge status: Home / Self Care | Payer: Self-pay | Source: Ambulatory Visit | Attending: Internal Medicine

## 2014-11-07 ENCOUNTER — Ambulatory Visit (HOSPITAL_COMMUNITY)
Admission: RE | Admit: 2014-11-07 | Discharge: 2014-11-07 | Disposition: A | Discharge status: Home / Self Care | Payer: BC Managed Care – PPO | Source: Ambulatory Visit | Attending: Internal Medicine | Admitting: Internal Medicine

## 2014-11-07 ENCOUNTER — Encounter (HOSPITAL_COMMUNITY): Payer: Self-pay

## 2014-11-07 DIAGNOSIS — Z79899 Other long term (current) drug therapy: Secondary | ICD-10-CM | POA: Insufficient documentation

## 2014-11-07 DIAGNOSIS — Z853 Personal history of malignant neoplasm of breast: Secondary | ICD-10-CM | POA: Diagnosis not present

## 2014-11-07 DIAGNOSIS — K219 Gastro-esophageal reflux disease without esophagitis: Secondary | ICD-10-CM | POA: Insufficient documentation

## 2014-11-07 DIAGNOSIS — D125 Benign neoplasm of sigmoid colon: Secondary | ICD-10-CM | POA: Insufficient documentation

## 2014-11-07 DIAGNOSIS — Z1211 Encounter for screening for malignant neoplasm of colon: Secondary | ICD-10-CM

## 2014-11-07 HISTORY — PX: COLONOSCOPY: SHX5424

## 2014-11-07 SURGERY — COLONOSCOPY
Anesthesia: Moderate Sedation

## 2014-11-07 MED ORDER — MIDAZOLAM HCL 5 MG/5ML IJ SOLN
INTRAMUSCULAR | Status: DC | PRN
  Administered 2014-11-07: 2 mg via INTRAVENOUS
  Administered 2014-11-07 (×2): 1 mg via INTRAVENOUS
  Administered 2014-11-07: 2 mg via INTRAVENOUS
  Administered 2014-11-07: 1 mg via INTRAVENOUS
  Administered 2014-11-07: 2 mg via INTRAVENOUS

## 2014-11-07 MED ORDER — MEPERIDINE HCL 50 MG/ML IJ SOLN
INTRAMUSCULAR | Status: AC
  Filled 2014-11-07: qty 1

## 2014-11-07 MED ORDER — STERILE WATER FOR IRRIGATION IR SOLN
Status: DC | PRN
  Administered 2014-11-07: 12:00:00

## 2014-11-07 MED ORDER — MEPERIDINE HCL 50 MG/ML IJ SOLN
INTRAMUSCULAR | Status: DC | PRN
  Administered 2014-11-07 (×2): 25 mg

## 2014-11-07 MED ORDER — MIDAZOLAM HCL 5 MG/5ML IJ SOLN
INTRAMUSCULAR | Status: DC | PRN
  Administered 2014-11-07: 1 mg via INTRAVENOUS

## 2014-11-07 MED ORDER — MIDAZOLAM HCL 5 MG/5ML IJ SOLN
INTRAMUSCULAR | Status: AC
  Filled 2014-11-07: qty 10

## 2014-11-07 MED ORDER — SODIUM CHLORIDE 0.9 % IV SOLN
INTRAVENOUS | Status: DC
  Administered 2014-11-07: 11:00:00 via INTRAVENOUS

## 2014-11-07 NOTE — Discharge Instructions (Signed)
Resume usual diet and medications. No driving for 24 hours. Physician will call with biopsy results.    Colon Polyps Polyps are lumps of extra tissue growing inside the body. Polyps can grow in the large intestine (colon). Most colon polyps are noncancerous (benign). However, some colon polyps can become cancerous over time. Polyps that are larger than a pea may be harmful. To be safe, caregivers remove and test all polyps. CAUSES  Polyps form when mutations in the genes cause your cells to grow and divide even though no more tissue is needed. RISK FACTORS There are a number of risk factors that can increase your chances of getting colon polyps. They include:  Being older than 50 years.  Family history of colon polyps or colon cancer.  Long-term colon diseases, such as colitis or Crohn disease.  Being overweight.  Smoking.  Being inactive.  Drinking too much alcohol. SYMPTOMS  Most small polyps do not cause symptoms. If symptoms are present, they may include:  Blood in the stool. The stool may look dark red or black.  Constipation or diarrhea that lasts longer than 1 week. DIAGNOSIS People often do not know they have polyps until their caregiver finds them during a regular checkup. Your caregiver can use 4 tests to check for polyps:  Digital rectal exam. The caregiver wears gloves and feels inside the rectum. This test would find polyps only in the rectum.  Barium enema. The caregiver puts a liquid called barium into your rectum before taking X-rays of your colon. Barium makes your colon look white. Polyps are dark, so they are easy to see in the X-ray pictures.  Sigmoidoscopy. A thin, flexible tube (sigmoidoscope) is placed into your rectum. The sigmoidoscope has a light and tiny camera in it. The caregiver uses the sigmoidoscope to look at the last third of your colon.  Colonoscopy. This test is like sigmoidoscopy, but the caregiver looks at the entire colon. This is the  most common method for finding and removing polyps. TREATMENT  Any polyps will be removed during a sigmoidoscopy or colonoscopy. The polyps are then tested for cancer. PREVENTION  To help lower your risk of getting more colon polyps:  Eat plenty of fruits and vegetables. Avoid eating fatty foods.  Do not smoke.  Avoid drinking alcohol.  Exercise every day.  Lose weight if recommended by your caregiver.  Eat plenty of calcium and folate. Foods that are rich in calcium include milk, cheese, and broccoli. Foods that are rich in folate include chickpeas, kidney beans, and spinach. HOME CARE INSTRUCTIONS Keep all follow-up appointments as directed by your caregiver. You may need periodic exams to check for polyps. SEEK MEDICAL CARE IF: You notice bleeding during a bowel movement. Document Released: 09/08/2004 Document Revised: 03/06/2012 Document Reviewed: 02/22/2012 [facility] Patient Information 2015 [place], [place]. This information is not intended to replace advice given to you by your health care provider. Make sure you discuss any questions you have with your health care provider.

## 2014-11-07 NOTE — H&P (Signed)
[name] is an 58 y.o. female.   Chief Complaint: Patient is here for colonoscopy. HPI: Patient is 58 year old Caucasian female who is here for screening colonoscopy. She denies abdominal pain change in bowel habits or rectal bleeding. She has personal history of breast carcinoma and remains in remission. Family history is negative for CRC.  Past Medical History  Diagnosis Date  . GERD (gastroesophageal reflux disease)     Past Surgical History  Procedure Laterality Date  . Abdominal hysterectomy  August 2010  . Breast surgery  2010    Breast cancer    History reviewed. No pertinent family history. Social History:  reports that she has never smoked. She does not have any smokeless tobacco history on file. She reports that she does not drink alcohol or use illicit drugs.  Allergies: No Known Allergies  Medications Prior to Admission  Medication Sig Dispense Refill  . Calcium-Phosphorus-Vitamin D (CITRACAL CALCIUM GUMMIES PO) Take by mouth daily. 2 tablet daily     . Multiple Vitamin (MULTIVITAMIN WITH MINERALS) TABS tablet Take 1 tablet by mouth daily.    . Omega-3 Fatty Acids (FISH OIL PO) Take 1 capsule by mouth daily. 1 tablet    . omeprazole (PRILOSEC) 40 MG capsule Take 1 capsule (40 mg total) by mouth daily. 30 capsule 11  . peg 3350 powder (MOVIPREP) 100 G SOLR Take 1 kit (200 g total) by mouth once. 1 kit 0    No results found for this or any previous visit (from the past 48 hour(s)). No results found.  ROS  Blood pressure 123/73, pulse 79, temperature 97.8 F (36.6 C), temperature source Oral, resp. rate 20, SpO2 96 %. Physical Exam  Constitutional: She appears well-developed and well-nourished.  HENT:  Mouth/Throat: Oropharynx is clear and moist.  Eyes: Conjunctivae are normal. No scleral icterus.  Neck: No thyromegaly present.  Cardiovascular: Normal rate, regular rhythm and intact distal pulses.   Respiratory: Effort normal and breath sounds normal.   GI: Soft. She exhibits no distension and no mass. There is no tenderness.  Musculoskeletal: She exhibits no edema.  Lymphadenopathy:    She has no cervical adenopathy.  Neurological: She is alert.  Skin: Skin is warm and dry.     Assessment/Plan Average risk screening colonoscopy.  Melony Tenpas U 11/07/2014, 12:30 PM

## 2014-11-07 NOTE — Op Note (Signed)
COLONOSCOPY PROCEDURE REPORT  PATIENT:  [name]  MR#:  [id number] Birthdate:  1956-07-13, 58 y.o., female Endoscopist:  Dr. Rogene Houston, MD   Procedure Date: 11/07/2014  Procedure:   Colonoscopy  Indications: Patient is 58 year old Caucasian female who is undergoing average risk screening colonoscopy.  Informed Consent:  The procedure and risks were reviewed with the patient and informed consent was obtained.  Medications:  Demerol 50 mg IV Versed 10 mg IV  Description of procedure:  After a digital rectal exam was performed, that colonoscope was advanced from the anus through the rectum and colon to the area of the cecum, ileocecal valve and appendiceal orifice. The cecum was deeply intubated. These structures were well-seen and photographed for the record. From the level of the cecum and ileocecal valve, the scope was slowly and cautiously withdrawn. The mucosal surfaces were carefully surveyed utilizing scope tip to flexion to facilitate fold flattening as needed. The scope was pulled down into the rectum where a thorough exam including retroflexion was performed.  Findings:   Prep satisfactory.cecum was washed free of stool in mucosa was thoroughly examined. 5 mm polyp cold snared from distal sigmoid colon. Normal rectal mucosa and anal rectal junction.   Therapeutic/Diagnostic Maneuvers Performed:  See above  Complications:  none  Cecal Withdrawal Time:  12 minutes  Impression:  Examination performed to cecum. Small polyp cold snared from distal sigmoid colon.  Recommendations:  Standard instructions given. I will contact patient with biopsy results and further recommendations.  [name] U  11/07/2014 1:05 PM  CC: Dr. Rayne Du PCP Per Patient & Dr. No ref. provider found

## 2014-11-11 ENCOUNTER — Encounter (HOSPITAL_COMMUNITY): Payer: Self-pay | Admitting: Internal Medicine

## 2014-12-05 ENCOUNTER — Other Ambulatory Visit: Payer: Self-pay | Admitting: Oncology

## 2014-12-05 DIAGNOSIS — Z853 Personal history of malignant neoplasm of breast: Secondary | ICD-10-CM

## 2014-12-05 DIAGNOSIS — Z9889 Other specified postprocedural states: Secondary | ICD-10-CM

## 2015-03-03 ENCOUNTER — Telehealth (INDEPENDENT_AMBULATORY_CARE_PROVIDER_SITE_OTHER): Payer: Self-pay | Admitting: *Deleted

## 2015-03-03 NOTE — Telephone Encounter (Signed)
Dr. Laural Golden reduced her omeprazole from  40mg  to 20 mg. Would like a new Rx sent to [facility] in [place].

## 2015-03-06 ENCOUNTER — Other Ambulatory Visit (INDEPENDENT_AMBULATORY_CARE_PROVIDER_SITE_OTHER): Payer: Self-pay | Admitting: Internal Medicine

## 2015-03-06 MED ORDER — OMEPRAZOLE 20 MG PO CPDR: 20.0000 mg | DELAYED_RELEASE_CAPSULE | Freq: Every day | ORAL | Status: DC

## 2015-03-06 NOTE — Telephone Encounter (Signed)
Rx for Omeprazole 20mg  sent to her pharmacy

## 2015-03-31 ENCOUNTER — Ambulatory Visit
Admission: RE | Admit: 2015-03-31 | Discharge: 2015-03-31 | Disposition: A | Discharge status: Home / Self Care | Payer: Self-pay | Source: Ambulatory Visit | Attending: Oncology | Admitting: Oncology

## 2015-03-31 DIAGNOSIS — Z9889 Other specified postprocedural states: Secondary | ICD-10-CM

## 2015-03-31 DIAGNOSIS — Z853 Personal history of malignant neoplasm of breast: Secondary | ICD-10-CM

## 2015-04-16 ENCOUNTER — Encounter (INDEPENDENT_AMBULATORY_CARE_PROVIDER_SITE_OTHER): Payer: Self-pay | Admitting: *Deleted

## 2015-06-05 ENCOUNTER — Ambulatory Visit (INDEPENDENT_AMBULATORY_CARE_PROVIDER_SITE_OTHER): Payer: BLUE CROSS/BLUE SHIELD | Admitting: Internal Medicine

## 2015-10-08 ENCOUNTER — Ambulatory Visit (INDEPENDENT_AMBULATORY_CARE_PROVIDER_SITE_OTHER): Payer: BLUE CROSS/BLUE SHIELD | Admitting: Urology

## 2015-10-08 ENCOUNTER — Ambulatory Visit: Payer: BLUE CROSS/BLUE SHIELD | Admitting: Urology

## 2015-10-08 DIAGNOSIS — N390 Urinary tract infection, site not specified: Secondary | ICD-10-CM

## 2015-10-08 DIAGNOSIS — N3281 Overactive bladder: Secondary | ICD-10-CM

## 2015-10-08 DIAGNOSIS — R300 Dysuria: Secondary | ICD-10-CM | POA: Diagnosis not present

## 2015-10-29 ENCOUNTER — Ambulatory Visit (INDEPENDENT_AMBULATORY_CARE_PROVIDER_SITE_OTHER): Payer: BLUE CROSS/BLUE SHIELD | Admitting: Urology

## 2015-10-29 DIAGNOSIS — N390 Urinary tract infection, site not specified: Secondary | ICD-10-CM | POA: Diagnosis not present

## 2015-10-29 DIAGNOSIS — N3281 Overactive bladder: Secondary | ICD-10-CM

## 2015-10-29 DIAGNOSIS — R300 Dysuria: Secondary | ICD-10-CM

## 2015-11-12 ENCOUNTER — Ambulatory Visit (INDEPENDENT_AMBULATORY_CARE_PROVIDER_SITE_OTHER): Payer: BLUE CROSS/BLUE SHIELD | Admitting: Urology

## 2015-11-12 DIAGNOSIS — N3281 Overactive bladder: Secondary | ICD-10-CM | POA: Diagnosis not present

## 2015-11-12 DIAGNOSIS — N390 Urinary tract infection, site not specified: Secondary | ICD-10-CM

## 2015-11-12 DIAGNOSIS — R300 Dysuria: Secondary | ICD-10-CM

## 2015-12-24 ENCOUNTER — Other Ambulatory Visit: Payer: Self-pay | Admitting: Oncology

## 2015-12-24 DIAGNOSIS — Z853 Personal history of malignant neoplasm of breast: Secondary | ICD-10-CM

## 2016-02-11 ENCOUNTER — Ambulatory Visit (INDEPENDENT_AMBULATORY_CARE_PROVIDER_SITE_OTHER): Payer: BLUE CROSS/BLUE SHIELD | Admitting: Urology

## 2016-02-11 DIAGNOSIS — N390 Urinary tract infection, site not specified: Secondary | ICD-10-CM

## 2016-02-11 DIAGNOSIS — N3281 Overactive bladder: Secondary | ICD-10-CM

## 2016-03-27 ENCOUNTER — Other Ambulatory Visit (INDEPENDENT_AMBULATORY_CARE_PROVIDER_SITE_OTHER): Payer: Self-pay | Admitting: Internal Medicine

## 2016-04-09 ENCOUNTER — Ambulatory Visit
Admission: RE | Admit: 2016-04-09 | Discharge: 2016-04-09 | Disposition: A | Discharge status: Home / Self Care | Payer: BLUE CROSS/BLUE SHIELD | Source: Ambulatory Visit | Attending: Oncology | Admitting: Oncology

## 2016-04-09 DIAGNOSIS — Z853 Personal history of malignant neoplasm of breast: Secondary | ICD-10-CM

## 2016-08-11 ENCOUNTER — Ambulatory Visit: Payer: BLUE CROSS/BLUE SHIELD | Admitting: Urology

## 2016-08-15 ENCOUNTER — Encounter (HOSPITAL_COMMUNITY): Payer: Self-pay

## 2016-08-15 ENCOUNTER — Observation Stay (HOSPITAL_COMMUNITY)
Admission: EM | Admit: 2016-08-15 | Discharge: 2016-08-16 | Disposition: A | Discharge status: Home / Self Care | Payer: BLUE CROSS/BLUE SHIELD | Attending: Oncology | Admitting: Oncology

## 2016-08-15 ENCOUNTER — Emergency Department (HOSPITAL_COMMUNITY): Payer: BLUE CROSS/BLUE SHIELD

## 2016-08-15 DIAGNOSIS — T50905A Adverse effect of unspecified drugs, medicaments and biological substances, initial encounter: Secondary | ICD-10-CM

## 2016-08-15 DIAGNOSIS — Z853 Personal history of malignant neoplasm of breast: Secondary | ICD-10-CM | POA: Diagnosis not present

## 2016-08-15 DIAGNOSIS — W11XXXA Fall on and from ladder, initial encounter: Secondary | ICD-10-CM | POA: Diagnosis not present

## 2016-08-15 DIAGNOSIS — K219 Gastro-esophageal reflux disease without esophagitis: Secondary | ICD-10-CM | POA: Diagnosis not present

## 2016-08-15 DIAGNOSIS — J189 Pneumonia, unspecified organism: Secondary | ICD-10-CM

## 2016-08-15 DIAGNOSIS — R51 Headache: Secondary | ICD-10-CM | POA: Insufficient documentation

## 2016-08-15 DIAGNOSIS — R0602 Shortness of breath: Secondary | ICD-10-CM | POA: Diagnosis present

## 2016-08-15 DIAGNOSIS — Z79899 Other long term (current) drug therapy: Secondary | ICD-10-CM | POA: Insufficient documentation

## 2016-08-15 DIAGNOSIS — Z803 Family history of malignant neoplasm of breast: Secondary | ICD-10-CM | POA: Insufficient documentation

## 2016-08-15 DIAGNOSIS — Z87440 Personal history of urinary (tract) infections: Secondary | ICD-10-CM | POA: Insufficient documentation

## 2016-08-15 DIAGNOSIS — J704 Drug-induced interstitial lung disorders, unspecified: Principal | ICD-10-CM | POA: Insufficient documentation

## 2016-08-15 DIAGNOSIS — R938 Abnormal findings on diagnostic imaging of other specified body structures: Secondary | ICD-10-CM | POA: Diagnosis not present

## 2016-08-15 DIAGNOSIS — T378X5A Adverse effect of other specified systemic anti-infectives and antiparasitics, initial encounter: Secondary | ICD-10-CM | POA: Diagnosis not present

## 2016-08-15 DIAGNOSIS — R0902 Hypoxemia: Secondary | ICD-10-CM

## 2016-08-15 HISTORY — DX: Malignant (primary) neoplasm, unspecified: C80.1

## 2016-08-15 LAB — BASIC METABOLIC PANEL
Anion gap: 8 (ref 5–15)
BUN: 21 mg/dL — ABNORMAL HIGH (ref 6–20)
CO2: 26 mmol/L (ref 22–32)
CREATININE: 0.65 mg/dL (ref 0.44–1.00)
Calcium: 9.6 mg/dL (ref 8.9–10.3)
Chloride: 103 mmol/L (ref 101–111)
GFR calc Af Amer: >60 mL/min (ref >60)
Glucose, Bld: 107 mg/dL — ABNORMAL HIGH (ref 65–99)
Potassium: 4 mmol/L (ref 3.5–5.1)
Sodium: 137 mmol/L (ref 135–145)

## 2016-08-15 LAB — CBC
HCT: 43.2 % (ref 36.0–46.0)
Hemoglobin: 14.2 g/dL (ref 12.0–15.0)
MCH: 30.2 pg (ref 26.0–34.0)
MCHC: 32.9 g/dL (ref 30.0–36.0)
MCV: 91.9 fL (ref 78.0–100.0)
Platelets: 244 10*3/uL (ref 150–400)
RBC: 4.7 MIL/uL (ref 3.87–5.11)
RDW: 13.1 % (ref 11.5–15.5)
WBC: 9 10*3/uL (ref 4.0–10.5)

## 2016-08-15 LAB — I-STAT TROPONIN, ED: Troponin i, poc: 0 ng/mL (ref 0.00–0.08)

## 2016-08-15 LAB — BRAIN NATRIURETIC PEPTIDE: B Natriuretic Peptide: 22.5 pg/mL (ref 0.0–100.0)

## 2016-08-15 MED ORDER — IOPAMIDOL (ISOVUE-300) INJECTION 61%
INTRAVENOUS | Status: AC
  Filled 2016-08-15: qty 100

## 2016-08-15 MED ORDER — IOPAMIDOL (ISOVUE-370) INJECTION 76%
INTRAVENOUS | Status: AC
  Administered 2016-08-15: 60 mL
  Filled 2016-08-15: qty 100

## 2016-08-15 NOTE — ED Triage Notes (Signed)
Patient complains of exertional shortness of breath x 1 week, denies CP. Also lost her balance 3 days ago and fell backwards 5 feet striking back of head on grass, no loc, no blurred vision, no nausea. Denies cough, denies swelling

## 2016-08-15 NOTE — ED Provider Notes (Signed)
[facility] DEPT Provider Note   CSN: [id number] Arrival date & time: 08/15/16  1407     History   Chief Complaint Chief Complaint  Patient presents with  . Fall  . Shortness of Breath    HPI [name] is a 60 y.o. female.  Patient presents to the ED with a chief complaint of fall and shortness of breath.  1. SOB:  Patient reports having SOB with exertion for the past 1 month.  She states that her symptoms have been gradually worsening.  She states that she does not have any associated chest pain.  She states that she has not had any cough or fever. Her symptoms are worsened with activity and walking.  They are improved with rest. She has not tried taking anything for her symptoms.  2. Fall:  Patient reports falling off of a ladder on Monday of this week (6 days ago).  States that she fell backward after losing her balance.  She states that she landed on her back and hit her head.  She states that she has had some intermittent headaches since then.  She denies any LOC, numbness, weakness, tingling, slurred speech, or vomiting.  She has tried taking ibuprofen with mild relief.  She is not anticoagulated.   The history is provided by the patient. No language interpreter was used.    Past Medical History:  Diagnosis Date  . GERD (gastroesophageal reflux disease)     Patient Active Problem List   Diagnosis Date Noted  . GERD (gastroesophageal reflux disease) 06/04/2014    Past Surgical History:  Procedure Laterality Date  . ABDOMINAL HYSTERECTOMY  August 2010  . BREAST SURGERY  2010   Breast cancer  . COLONOSCOPY N/A 11/07/2014   Procedure: COLONOSCOPY;  Surgeon: Rogene Houston, MD;  Location: [facility];  Service: Endoscopy;  Laterality: N/A;  1200    OB History    No data available       Home Medications    Prior to Admission medications   Medication Sig Start Date End Date Taking? Authorizing Provider  Calcium-Phosphorus-Vitamin D (CITRACAL  CALCIUM GUMMIES PO) Take 2 each by mouth daily. 2 tablet daily    Yes Historical Provider, MD  cholecalciferol (VITAMIN D) 1000 units tablet Take 1 tablet by mouth daily.   Yes Historical Provider, MD  Multiple Vitamin (MULTIVITAMIN WITH MINERALS) TABS tablet Take 1 tablet by mouth daily.   Yes Historical Provider, MD  nitrofurantoin, macrocrystal-monohydrate, (MACROBID) 100 MG capsule Take 1 capsule by mouth at bedtime. 08/14/16  Yes Historical Provider, MD  Omega-3 Fatty Acids (FISH OIL PO) Take 1 capsule by mouth daily. 1 tablet   Yes Historical Provider, MD  omeprazole (PRILOSEC) 20 MG capsule TAKE 1 CAPSULE (20 MG TOTAL) BY MOUTH DAILY. 03/29/16  Yes Butch Penny, NP    Family History No family history on file.  Social History Social History  Substance Use Topics  . Smoking status: Never Smoker  . Smokeless tobacco: Never Used  . Alcohol use No     Allergies   Review of patient's allergies indicates no known allergies.   Review of Systems Review of Systems  Respiratory: Positive for shortness of breath.   Cardiovascular: Positive for chest pain.  All other systems reviewed and are negative.    Physical Exam Updated Vital Signs BP 106/89   Pulse 101   Temp 97.5 F (36.4 C) (Oral)   Resp 22   Ht 5\' 7"  (1.702 m)  Wt 64.4 kg   SpO2 (!) 89%   BMI 22.24 kg/m   Physical Exam  Constitutional: She is oriented to person, place, and time. She appears well-developed and well-nourished.  HENT:  Head: Normocephalic and atraumatic.  No scalp hematoma No evidence of traumatic head injury  Eyes: Conjunctivae and EOM are normal. Pupils are equal, round, and reactive to light.  Neck: Normal range of motion. Neck supple.  Cardiovascular: Normal rate and regular rhythm.  Exam reveals no gallop and no friction rub.   No murmur heard. Pulmonary/Chest: Effort normal and breath sounds normal. No respiratory distress. She has no wheezes. She has no rales. She exhibits no tenderness.   CTAB  Abdominal: Soft. Bowel sounds are normal. She exhibits no distension and no mass. There is no tenderness. There is no rebound and no guarding.  Musculoskeletal: Normal range of motion. She exhibits no edema or tenderness.  Moves all extremities  Neurological: She is alert and oriented to person, place, and time.  CN 3-12 intact Speech is clear Movements are goal oriented Normal sensation and strength  Skin: Skin is warm and dry.  Psychiatric: She has a normal mood and affect. Her behavior is normal. Judgment and thought content normal.  Nursing note and vitals reviewed.    ED Treatments / Results  Labs (all labs ordered are listed, but only abnormal results are displayed) Labs Reviewed  BASIC METABOLIC PANEL - Abnormal; Notable for the following:       Result Value   Glucose, Bld 107 (*)    BUN 21 (*)    All other components within normal limits  CBC  BRAIN NATRIURETIC PEPTIDE  I-STAT TROPOININ, ED    EKG  EKG Interpretation  Date/Time:  Sunday August 15 2016 14:20:12 EDT Ventricular Rate:  95 PR Interval:  152 QRS Duration: 76 QT Interval:  358 QTC Calculation: 449 R Axis:   53 Text Interpretation:  Normal sinus rhythm Normal ECG Since last tracing rate faster Otherwise no significant change Confirmed by Tyrone Nine MD, DANIEL [phone]) on 08/15/2016 3:13:15 PM       Radiology Dg Chest 2 View  Result Date: 08/15/2016 CLINICAL DATA:  Exertional shortness of breath for several weeks. History of breast cancer. EXAM: CHEST  2 VIEW COMPARISON:  11/03/2009. FINDINGS: The heart size and mediastinal contours are within normal limits. Significant BILATERAL pulmonary opacities, without effusion or pneumothorax. Some of these opacities are confluent, and others appear nodular. Osteopenia. IMPRESSION: BILATERAL pulmonary opacities are new from 2010. Metastatic breast cancer is not excluded. Infectious and inflammatory causes of pneumonitis could have this appearance. Correlate  clinically. CT chest with contrast could be helpful in further evaluation as clinically indicated. Electronically Signed   By: Staci Righter M.D.   On: 08/15/2016 15:19    Procedures Procedures (including critical care time)  Medications Ordered in ED Medications - No data to display   Initial Impression / Assessment and Plan / ED Course  I have reviewed the triage vital signs and the nursing notes.  Pertinent labs & imaging results that were available during my care of the patient were reviewed by me and considered in my medical decision making (see chart for details).  Clinical Course    Patient with fall and shortness of breath.  1. SOB - Check CT angio to rule out PE give prior cancer and also look for metastatic disease. - Troponin negative, no ischemic EKG changes.  Doubt ACS. - Noted to be 89% on RA.  Put on 2L [place] and cardiac monitor.  2. Fall/Head injury - Injury was 6 days ago.  Neurovascularly intact.  No evidence of traumatic head injury.  Recommend limiting screen time, tylenol for headaches, and rest and fluids.  PCP follow-up.  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  CT negative for pulmonary embolism, negative for metastatic disease, possible hypersensitivity pneumonitis. I discussed patient with pulmonology, who will consult. Patient will be admitted to medicine for hypoxia.  Appreciate internal medicine teaching service for admitting the patient.  Final Clinical Impressions(s) / ED Diagnoses   Final diagnoses:  Hypoxia    New Prescriptions New Prescriptions   No medications on file     Montine Circle, PA-C 08/15/16 [place], DO 08/15/16 2103

## 2016-08-15 NOTE — ED Notes (Signed)
Meal tray ordered 

## 2016-08-15 NOTE — Consult Note (Signed)
Name: [name] MRN: [id number] DOB: Apr 27, 1956    ADMISSION DATE:  08/15/2016 CONSULTATION DATE:  08/15/16  REFERRING MD :  Dr Tyrone Nine  Reason for consultation: B ground glass infiltrates and hypoxemia    SIGNIFICANT EVENTS   STUDIES:   HISTORY OF PRESENT ILLNESS:  60 yo never smoker with hx GERD, remote breast CA treated w L lumpectomy and XRT (2010). She presented to the ED on 8/20 complaining of 1 month of progressive dyspnea. She had also suffered a fall from a ladder 1 week ago and was evaluated for this. Her complaint of SOB prompted a CT-PA that shows no PE, some mild perihilar LAD, and some patchy ground glass infiltrates bilaterally with a slightly peripheral distribution (but widespread). PCCM consultation requested regarding the CT scan abnormalities.   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  has a past surgical history that includes Abdominal hysterectomy (August 2010); Breast surgery (2010); and Colonoscopy (N/A, 11/07/2014). Prior to Admission medications   Medication Sig Start Date End Date Taking? Authorizing Provider  Calcium-Phosphorus-Vitamin D (CITRACAL CALCIUM GUMMIES PO) Take 2 each by mouth daily. 2 tablet daily    Yes Historical Provider, MD  cholecalciferol (VITAMIN D) 1000 units tablet Take 1 tablet by mouth daily.   Yes Historical Provider, MD  Multiple Vitamin (MULTIVITAMIN WITH MINERALS) TABS tablet Take 1 tablet by mouth daily.   Yes Historical  Provider, MD  nitrofurantoin, macrocrystal-monohydrate, (MACROBID) 100 MG capsule Take 1 capsule by mouth at bedtime. 08/14/16  Yes Historical Provider, MD  Omega-3 Fatty Acids (FISH OIL PO) Take 1 capsule by mouth daily. 1 tablet   Yes Historical Provider, MD  omeprazole (PRILOSEC) 20 MG capsule TAKE 1 CAPSULE (20 MG TOTAL) BY MOUTH DAILY. 03/29/16  Yes Butch Penny, NP   No Known Allergies  FAMILY HISTORY:  family history is not on file. SOCIAL HISTORY:  reports that she has never smoked. She has never used smokeless tobacco. She reports that she does not drink alcohol or use drugs.  REVIEW OF SYSTEMS:   Constitutional: Negative for fever, chills, weight loss, malaise/fatigue and diaphoresis.  HENT: Negative for hearing loss, ear pain, nosebleeds, congestion, sore throat, neck pain, tinnitus and ear discharge.   Eyes: Negative for blurred vision, double vision, photophobia, pain, discharge and redness.  Respiratory: Negative for cough, hemoptysis, sputum production, shortness of breath, wheezing and stridor.   Cardiovascular: Negative for chest pain, palpitations, orthopnea, claudication, leg swelling and PND.  Gastrointestinal: Negative for heartburn, nausea, vomiting, abdominal pain, diarrhea, constipation, blood in stool and melena.  Genitourinary: Negative for dysuria, urgency, frequency, hematuria and flank pain.  Musculoskeletal: Negative for myalgias, back pain, joint pain and falls.  Skin: Negative for itching and rash.  Neurological: Negative for dizziness, tingling, tremors, sensory change, speech change, focal weakness, seizures, loss of consciousness, weakness and headaches.  Endo/Heme/Allergies: Negative for environmental allergies and polydipsia. Does not bruise/bleed easily.  SUBJECTIVE:   VITAL SIGNS: Temp:  [97.5  F (36.4 C)-98 F (36.7 C)] 98 F (36.7 C) (08/20 1723) Pulse Rate:  [71-101] 71 (08/20 1732) Resp:  [14-22] 14 (08/20 1732) BP: (106-139)/(68-89) 139/78  (08/20 1732) SpO2:  [81 %-92 %] 81 % (08/20 1732) Weight:  [64.4 kg (142 lb)] 64.4 kg (142 lb) (08/20 1416)  PHYSICAL EXAMINATION: General:  Pleasant woman, NAD Neuro:  Awake and alert, non-focal HEENT:  Op clear, no lesions, PERRL Cardiovascular:  RRR no M Lungs:  Soft B crackles in mid lung zones Abdomen:  benign Musculoskeletal:  No deformities or edema Skin:  No rash   Recent Labs Lab 08/15/16 1424  NA 137  K 4.0  CL 103  CO2 26  BUN 21*  CREATININE 0.65  GLUCOSE 107*    Recent Labs Lab 08/15/16 1424  HGB 14.2  HCT 43.2  WBC 9.0  PLT 244   Dg Chest 2 View  Result Date: 08/15/2016 CLINICAL DATA:  Exertional shortness of breath for several weeks. History of breast cancer. EXAM: CHEST  2 VIEW COMPARISON:  11/03/2009. FINDINGS: The heart size and mediastinal contours are within normal limits. Significant BILATERAL pulmonary opacities, without effusion or pneumothorax. Some of these opacities are confluent, and others appear nodular. Osteopenia. IMPRESSION: BILATERAL pulmonary opacities are new from 2010. Metastatic breast cancer is not excluded. Infectious and inflammatory causes of pneumonitis could have this appearance. Correlate clinically. CT chest with contrast could be helpful in further evaluation as clinically indicated. Electronically Signed   By: Staci Righter M.D.   On: 08/15/2016 15:19   Ct Angio Chest Pe W Or Wo Contrast  Result Date: 08/15/2016 CLINICAL DATA:  Shortness of breath for 1 month, history of gastroesophageal reflux, breast cancer, EXAM: CT ANGIOGRAPHY CHEST WITH CONTRAST TECHNIQUE: Multidetector CT imaging of the chest was performed using the standard protocol during bolus administration of intravenous contrast. Multiplanar CT image reconstructions and MIPs were obtained to evaluate the vascular anatomy. CONTRAST:  60 cc Isovue COMPARISON:  None. FINDINGS: Images of the thoracic inlet are unremarkable. Ascending aorta measures 3.3 cm in diameter.  Descending aorta measures 2.4 cm in diameter. The study is of excellent technical quality. No aortic dissection. No pulmonary embolus is noted. No mediastinal hematoma or adenopathy. A right hilar lymph node measures 1 cm in short-axis. Sub- carinal lymph node measures 8 mm short-axis. Left hilar lymph node measures 1.1 cm short-axis. AP window lymph node measures 8 mm short-axis. Images of the lung parenchyma shows bilateral interstitial prominence. There is thickening of peripheral interlobular septa. The patchy ground-glass infiltrates with geographic predominantly peripheral distribution bilaterally. Findings highly suspicious for nonspecific pneumonitis, alveolar proteinosis, hyper sensitivity pneumonitis cannot be excluded. Less likely aspiration pneumonia or bacterial pneumonia. Clinical correlation is necessary. Correlation with pulmonology cons of this is recommended. Further correlation with high-resolution CT of the chest could be performed as clinically warranted. Sagittal images of the spine shows no destructive bony lesions. Minimal degenerative changes mid thoracic spine. Sagittal view of the sternum is unremarkable. The visualized upper abdomen shows no adrenal gland mass. Review of the MIP images confirms the above findings. IMPRESSION: 1. No pulmonary embolus is noted.  No aortic aneurysm or dissection. 2. No mediastinal hematoma or adenopathy. Borderline enlarged bilateral hilar lymph nodes. 3. There is interstitial prominence bilaterally. Thickening of bilateral peripheral interlobular septa. Patchy bilateral ground-glass alveolar geographic infiltrates predominantly with peripheral distribution. Findings suspicious for nonspecific pneumonitis. Alveolar proteinosis or hypersensitivity pneumonitis cannot be excluded. Less likely bacterial pneumonia or pulmonary edema. Less likely aspiration pneumonia.  Clinical correlation is necessary. Pulmonology consult is recommended. 4. Minimal degenerative  changes thoracic spine. Electronically Signed   By: Lahoma Crocker M.D.   On: 08/15/2016 17:01    ASSESSMENT / PLAN:  Bilateral pneumonitis with hypoxemia. Given her exposure profile, this is almost certainly due to the nitrofurantoin exposure. Usually discontinuation of the medication is adequate to allow the infiltrates to resolve. Sometimes corticosteroids are given but I do not know of any studies that definitively support their use. I would recommend stopping nitrofurantoin altogether. Then have her follow up with [facility] for reassessment and reimaging in 3-4 weeks. We can check CXR or repeat Ct chest at that time. If the infiltrates persist then either try steroids or more likely arrange for bronchoscopy to gather more data.   Baltazar Apo, MD, PhD 08/15/2016, 6:47 PM [facility] [phone] or if no answer [phone]

## 2016-08-15 NOTE — ED Notes (Signed)
Ambulated Pt in hallway with Pulse Ox without O2. Starting out in bed at 92%. While ambulating with Pt in hallway pulse Ox went down to 85%. Brought Pt back into room, hooked pt back up to the monitor pulse Ox then was 81% with good wave form. Per PA Rob placed pt on 2L nasal O2. While typing note beside pt, pt's O2 is currently 99% on 2L.

## 2016-08-15 NOTE — H&P (Signed)
Date: 08/15/2016               Patient Name:  [name] MRN: [id number]  DOB: [birth year] Age / Sex: 60 y.o., female   PCP: Jane Service: Internal Medicine Teaching Service         Attending Physician: Dr. Annia Belt, MD    First Contact: Dr. Holley Raring Pager: [phone]  Second Contact: Dr. Charlott Rakes Pager: [phone]       After Hours (After 5p/  First Contact Pager: [phone]  weekends / holidays): Second Contact Pager: [phone]   Chief Complaint: Shortness of breath   History of Present Illness: [name] is a 60 year old woman, never-smoker, with PMH GERD, remote R breast cancer (s/p lumpx + XRT 2010), and chronic recurrent UTIs on Nitrofurantoin who presented to the ED for one month of progressive dyspnea on exertion. She initially noticed dyspnea on climbing stairs a month ago, and this has progressed to dyspnea with minimal walking on level surface which improves with rest. She endorses sensation of chest tightness and palpitations while short of breath with activity. She thought allergies were perhaps responsible and took claritin to no effect. 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 She also mentions a fall from ladder one week ago with no trauma/LOC, evaluated and cleared with CT head per patient. She denies cough, chest pain, fevers/chills, sig weight gain/loss, travel, chemical exposures, bird exposures, rash, abdominal pain, or cold symptoms.   In ED, presented afebrile, HR 101, RR 22, BP 106/69, SpO2 89% on RA, O2 drops to 81% with ambulation, corrects to 99% with 2L via [place]. Labs unremarkable, CXR concerning for new bilateral opacities, CTA chest revealed nonspecific bilateral pneumonitis with extensive interstitial prominence. Evaluated by pulmonology who feel that this is very likely to be secondary to nitrofurantoin exposure.   Meds:  Current Meds    Medication Sig  . Calcium-Phosphorus-Vitamin D (CITRACAL CALCIUM GUMMIES PO) Take 2 each by mouth daily. 2 tablet daily   . cholecalciferol (VITAMIN D) 1000 units tablet Take 1 tablet by mouth daily.  . Multiple Vitamin (MULTIVITAMIN WITH MINERALS) TABS tablet Take 1 tablet by mouth daily.  . nitrofurantoin, macrocrystal-monohydrate, (MACROBID) 100 MG capsule Take 1 capsule by mouth at bedtime.  . Omega-3 Fatty Acids (FISH OIL PO) Take 1 capsule by mouth daily. 1 tablet  . omeprazole (PRILOSEC) 20 MG capsule TAKE 1 CAPSULE (20 MG TOTAL) BY MOUTH DAILY.   Allergies: Allergies as of 08/15/2016  . (No Known Allergies)   Past Medical History:  Diagnosis Date  . Cancer ([place])   . GERD (gastroesophageal reflux disease)     Family History: Mother had breast cancer, father had lung cancer.  Social History:  Social History   Social History  . Marital status: Widowed    Spouse name: N/A  . Number of children: N/A  . Years of education: N/A   Occupational History  . Not on file.   Social History Main Topics  . Smoking status: Never Smoker  . Smokeless tobacco: Never Used  . Alcohol use No  . Drug use: No  . Sexual activity: Not on file   Other Topics Concern  . Not on file   Social History Narrative   Lives at home with pets in [place], [place], family nearby and active and fully functional in ADLs, IADLs. 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   Review of Systems: A complete ROS was negative except as per HPI.   Physical Exam: Blood pressure 139/78, pulse 71, temperature 98 F (36.7 C), resp. rate 14, height 5\' 7"  (1.702 m), weight 64.4 kg (142 lb), SpO2 (!) 81 %.  SpO2 ranges 85-95% during conversation, denies feeling short of breath  General appearance: Woman resting comfortably in ED stretcher, conversational, in no distress HENT: Normocephalic, atraumatic, moist mucous membranes, neck supple, no JVD Eyes: PERRL, EOM intact Cardiovascular: Regular rate and  rhythm, no m/r/g Respiratory: Mild bibasilar crackles, no rhonchi/wheezing, normal work of breathing Abdomen: BS+, soft, non-tender, non-distended Skin: Warm, dry, intact Extremities: Normal bulk and ROM, no edema, 2+ peripheral pulses Neuro: Cranial nerves grossly intact, alert and oriented Psych: Appropriate affect  EKG: NSR  CXR: IMPRESSION: BILATERAL pulmonary opacities are new from 2010. Metastatic breast cancer is not excluded. Infectious and inflammatory causes of pneumonitis could have this appearance. Correlate clinically. CT chest with contrast could be helpful in further evaluation as clinically indicated CTA Chest PE w/wo Con:  IMPRESSION: 1. No pulmonary embolus is noted.  No aortic aneurysm or dissection. 2. No mediastinal hematoma or adenopathy. Borderline enlarged bilateral hilar lymph nodes. 3. There is interstitial prominence bilaterally. Thickening of bilateral peripheral interlobular septa. Patchy bilateral ground-glass alveolar geographic infiltrates predominantly with peripheral distribution. Findings suspicious for nonspecific pneumonitis. Alveolar proteinosis or hypersensitivity pneumonitis cannot be excluded. Less likely bacterial pneumonia or pulmonary edema. Less likely aspiration pneumonia. Clinical correlation is necessary. Pulmonology consult is recommended. 4. Minimal degenerative changes thoracic spine.  Assessment & Plan by Problem:  Drug-induced pneumonitis with hypoxia, progressive DOE over 1 month, very likely nitrofurantoin exposure-related, infectious causes less likely given no fever/chills/cough/travel, clinically very stable with exception for desat with ambulation. May require short-course of home oxygen while lungs heal and nitrofurantoin washes out.  -- Discontinue nitrofurantoin  -- Supplementary oxygen as needed to maintain O2 sat >93%  -- Recheck ambulatory oxygenation tomorrow  -- PT eval  -- Follow pulm recs - will need pulm follow up for  repeat imaging in 3-4 weeks and they will determine need for steroids/bronchoscopy  Mild BUN elevation, only lab abnormality present  -- Recheck BMP tomorrow morning  Chronic recurrent UTIs, stable  -- Cannot take nitrofurantoin any longer, will need to follow up with PCP to select alternative abx prophylaxis  FEN/GI: Regular diet, continue home omeprazole  DVT ppx: Lovenox  Code status: Full code  Dispo: Admit patient to Observation with expected length of stay less than 2 midnights.  Signed: Asencion Partridge, MD 08/15/2016, 6:54 PM  Pager: [phone]

## 2016-08-16 ENCOUNTER — Telehealth: Payer: Self-pay | Admitting: *Deleted

## 2016-08-16 DIAGNOSIS — J702 Acute drug-induced interstitial lung disorders: Secondary | ICD-10-CM

## 2016-08-16 DIAGNOSIS — T378X5A Adverse effect of other specified systemic anti-infectives and antiparasitics, initial encounter: Secondary | ICD-10-CM | POA: Diagnosis not present

## 2016-08-16 LAB — BASIC METABOLIC PANEL
ANION GAP: 10 (ref 5–15)
BUN: 21 mg/dL — ABNORMAL HIGH (ref 6–20)
CALCIUM: 9.5 mg/dL (ref 8.9–10.3)
CHLORIDE: 105 mmol/L (ref 101–111)
CO2: 23 mmol/L (ref 22–32)
CREATININE: 0.61 mg/dL (ref 0.44–1.00)
GFR calc Af Amer: >60 mL/min (ref >60)
GFR calc non Af Amer: >60 mL/min (ref >60)
GLUCOSE: 100 mg/dL — AB (ref 65–99)
Potassium: 4 mmol/L (ref 3.5–5.1)
Sodium: 138 mmol/L (ref 135–145)

## 2016-08-16 MED ORDER — VITAMIN D 1000 UNITS PO TABS: 1000.0000 [IU] | ORAL_TABLET | Freq: Every day | ORAL | Status: DC

## 2016-08-16 MED ORDER — ACETAMINOPHEN 650 MG RE SUPP: 650.0000 mg | Freq: Four times a day (QID) | RECTAL | Status: DC | PRN

## 2016-08-16 MED ORDER — ACETAMINOPHEN 325 MG PO TABS: 650.0000 mg | ORAL_TABLET | Freq: Four times a day (QID) | ORAL | Status: DC | PRN

## 2016-08-16 MED ORDER — ENOXAPARIN SODIUM 40 MG/0.4ML ~~LOC~~ SOLN
40.0000 mg | Freq: Every day | SUBCUTANEOUS | Status: DC
  Filled 2016-08-16: qty 0.4

## 2016-08-16 MED ORDER — PREDNISONE 20 MG PO TABS: ORAL_TABLET | ORAL | 0 refills | Status: AC

## 2016-08-16 MED ORDER — PANTOPRAZOLE SODIUM 40 MG PO TBEC: 40.0000 mg | DELAYED_RELEASE_TABLET | Freq: Every day | ORAL | Status: DC

## 2016-08-16 NOTE — ED Notes (Signed)
Attempted to transport Pt to 5W. Pt stated she did not want to be admitted. RN notified

## 2016-08-16 NOTE — ED Notes (Signed)
Inpatient MD to order case management consult to arrange home oxygen for patient.  O2 sat 80% on room air during ambulation.  96% on 2L [place].

## 2016-08-16 NOTE — Discharge Summary (Signed)
Name: [name] MRN: [id number] DOB: [birth year] 60 y.o. PCP: [facility]  Date of Admission: 08/15/2016  2:55 PM Date of Discharge: 08/16/2016 Attending Physician: Annia Belt, MD  Discharge Diagnosis: 1. Nitrofurantoin-associated pneumonitis  Discharge Medications:   Medication List    TAKE these medications   cholecalciferol 1000 units tablet Commonly known as:  VITAMIN D Take 1 tablet by mouth daily.   CITRACAL CALCIUM GUMMIES PO Take 2 each by mouth daily. 2 tablet daily   FISH OIL PO Take 1 capsule by mouth daily. 1 tablet   multivitamin with minerals Tabs tablet Take 1 tablet by mouth daily.   omeprazole 20 MG capsule Commonly known as:  PRILOSEC TAKE 1 CAPSULE (20 MG TOTAL) BY MOUTH DAILY.   predniSONE 20 MG tablet Commonly known as:  DELTASONE Take 3 tabs (60mg ) daily for 5 days, then 2 tabs (40mg ) daily for 5 days, then 1 tab (20mg ) daily for 5 days, then stop.      Disposition and follow-up:   Ms.[name] was discharged from [facility] in Good condition.  At the hospital follow up visit please address:  1.  Pneumonitis: check SOB symptoms, O2 sat, steroid tolerance? UTI: recurrance of UTIs, need for alternate ppx?  2.  Labs / imaging needed at time of follow-up: O2 sat  Follow-up Appointments: Follow-up Information    [employer] He. Schedule an appointment as soon as possible for a visit in 2 week(s).   Contact information: [address] [place] [address] [phone]        [name]., MD. Schedule an appointment as soon as possible for a visit in 3 week(s).   Specialty:  Pulmonary Disease Contact information: [address] [place] [address] [phone]          Hospital Course by problem list: Principal Problem:   Drug-induced pneumonitis Active Problems:   Hypoxia   1. Pneumonitis: Pt presented with 1 month h/o worseing DOE and SOB in the  setting of chronic nitrofurantoin UTI ppx. She underwent a CT Chest and was noted to have signs of pneumonitis. Pulmonology was consulted and they recommended that symptoms were most consistent with durg-associate pneumonitis. Nitrofurantoin was discontinued. Pt remained stable on RA, but demonstrated some O2 desaturations w/ ambulation. She expressed a desire for discharge and we recommended slow return to activity as the nitrofurnatoin washes out of her system. She was also discharged with a prednisone steroid taper of 5 day 60mg , 40mg , 20mg  to facilitate recovery.  Discharge Vitals:   BP 104/71   Pulse 73   Temp 98 F (36.7 C)   Resp 15   Ht 5\' 7"  (1.702 m)   Wt 142 lb (64.4 kg)   SpO2 95%   BMI 22.24 kg/m   Pertinent Labs, Studies, and Procedures: As above.  Procedures Performed:  Dg Chest 2 View  Result Date: 08/15/2016 CLINICAL DATA:  Exertional shortness of breath for several weeks. History of breast cancer. EXAM: CHEST  2 VIEW COMPARISON:  11/03/2009. FINDINGS: The heart size and mediastinal contours are within normal limits. Significant BILATERAL pulmonary opacities, without effusion or pneumothorax. Some of these opacities are confluent, and others appear nodular. Osteopenia. IMPRESSION: BILATERAL pulmonary opacities are new from 2010. Metastatic breast cancer is not excluded. Infectious and inflammatory causes of pneumonitis could have this appearance. Correlate clinically. CT chest with contrast could be helpful in further evaluation as clinically indicated. Electronically Signed   By: Madie Reno  Curnes M.D.   On: 08/15/2016 15:19   Ct Angio Chest Pe W Or Wo Contrast  Result Date: 08/15/2016 CLINICAL DATA:  Shortness of breath for 1 month, history of gastroesophageal reflux, breast cancer, EXAM: CT ANGIOGRAPHY CHEST WITH CONTRAST TECHNIQUE: Multidetector CT imaging of the chest was performed using the standard protocol during bolus administration of intravenous contrast. Multiplanar  CT image reconstructions and MIPs were obtained to evaluate the vascular anatomy. CONTRAST:  60 cc Isovue COMPARISON:  None. FINDINGS: Images of the thoracic inlet are unremarkable. Ascending aorta measures 3.3 cm in diameter. Descending aorta measures 2.4 cm in diameter. The study is of excellent technical quality. No aortic dissection. No pulmonary embolus is noted. No mediastinal hematoma or adenopathy. A right hilar lymph node measures 1 cm in short-axis. Sub- carinal lymph node measures 8 mm short-axis. Left hilar lymph node measures 1.1 cm short-axis. AP window lymph node measures 8 mm short-axis. Images of the lung parenchyma shows bilateral interstitial prominence. There is thickening of peripheral interlobular septa. The patchy ground-glass infiltrates with geographic predominantly peripheral distribution bilaterally. Findings highly suspicious for nonspecific pneumonitis, alveolar proteinosis, hyper sensitivity pneumonitis cannot be excluded. Less likely aspiration pneumonia or bacterial pneumonia. Clinical correlation is necessary. Correlation with pulmonology cons of this is recommended. Further correlation with high-resolution CT of the chest could be performed as clinically warranted. Sagittal images of the spine shows no destructive bony lesions. Minimal degenerative changes mid thoracic spine. Sagittal view of the sternum is unremarkable. The visualized upper abdomen shows no adrenal gland mass. Review of the MIP images confirms the above findings. IMPRESSION: 1. No pulmonary embolus is noted.  No aortic aneurysm or dissection. 2. No mediastinal hematoma or adenopathy. Borderline enlarged bilateral hilar lymph nodes. 3. There is interstitial prominence bilaterally. Thickening of bilateral peripheral interlobular septa. Patchy bilateral ground-glass alveolar geographic infiltrates predominantly with peripheral distribution. Findings suspicious for nonspecific pneumonitis. Alveolar proteinosis or  hypersensitivity pneumonitis cannot be excluded. Less likely bacterial pneumonia or pulmonary edema. Less likely aspiration pneumonia. Clinical correlation is necessary. Pulmonology consult is recommended. 4. Minimal degenerative changes thoracic spine. Electronically Signed   By: Lahoma Crocker M.D.   On: 08/15/2016 17:01   Consultations: Pulmonology, Dr. Baltazar Apo  Discharge Instructions: Discharge Instructions    Call MD for:  difficulty breathing, headache or visual disturbances    Complete by:  As directed   Call MD for:  extreme fatigue    Complete by:  As directed   Call MD for:  persistant dizziness or light-headedness    Complete by:  As directed   Diet - low sodium heart healthy    Complete by:  As directed   Discharge instructions    Complete by:  As directed   We feel that your symptoms are most likely related to your medication for urinary infections. We have stopped this medicine and your symptoms should slowly resolve. We will start a course of steroids to help accelerate your recovery.  Please follow up with your primary doctor if you have any further concerns.   Increase activity slowly    Complete by:  As directed     Signed: Holley Raring, MD 08/16/2016, 12:34 PM   Pager: [phone]

## 2016-08-16 NOTE — ED Notes (Signed)
MD to see patient to discuss plan of care and answer questions regarding further treatment.

## 2016-08-16 NOTE — ED Notes (Signed)
Patient refusing admission.  States she was under the impression that she would be referred to outpatient treatment.

## 2016-08-16 NOTE — Progress Notes (Signed)
PT Cancellation Note  Patient Details Name: [name] MRN: [id number] DOB: 11/08/1956   Cancelled Treatment:    Reason Eval/Treat Not Completed: PT screened, no needs identified, will sign off. Spoke with RN, Carlis Abbott and reports patients is indep with amb and ADLs, she was only desating without O2. If she has supplemental O2 she stays in the 90s and had minimal SOB. Pt with no further skilled PT needs at this time. Please re-consult if needed in future. Thank you!   Kingsley Callander 08/16/2016, 12:22 PM   Kittie Plater, PT, DPT Pager #: [phone] Office #: [phone]

## 2016-08-16 NOTE — Progress Notes (Signed)
   Subjective: Currently, the patient is comfortable lying bed w/ 2L O2 [place]. Reportedly she has desats to 80% on RA w/ ambulation, but tolerates rest w/o difficulty on RA. Wants to go home.  Interval Events: Pulmonology cleared pt for discharge. Nitrofurantoin stopped.  Objective: Vital signs in last 24 hours: Vitals:   08/16/16 0521 08/16/16 0600 08/16/16 0700 08/16/16 0800  BP: 112/70 111/68 127/71 104/71  Pulse: 78 (!) 59 76 73  Resp: (!) 28 24 15    Temp:      TempSrc:      SpO2: 96% 97% 99% 95%  Weight:      Height:       24-hour weight change: Weight change:  Intake/Output:  No intake/output data recorded.    Physical Exam: Physical Exam  Constitutional: She appears well-developed. She is cooperative. No distress.  Cardiovascular: Normal rate, regular rhythm, normal heart sounds and normal pulses.  Exam reveals no gallop.   No murmur heard. Pulmonary/Chest: Effort normal. No respiratory distress. She has rhonchi (fine crackles at bases and throughout).  Abdominal: Soft. Bowel sounds are normal. There is no tenderness.  Musculoskeletal: She exhibits no edema or tenderness.  Skin: She is not diaphoretic.   Labs: CBC:  Recent Labs Lab 08/15/16 1424  WBC 9.0  HGB 14.2  HCT 43.2  MCV 91.9  PLT [id number]   Metabolic Panel:  Recent Labs Lab 08/15/16 1424 08/16/16 0430  NA 137 138  K 4.0 4.0  CL 103 105  CO2 26 23  GLUCOSE 107* 100*  BUN 21* 21*  CREATININE 0.65 0.61  CALCIUM 9.6 9.5   Cardiac Labs:  Recent Labs Lab 08/15/16 1424 08/15/16 1432  TROPIPOC  --  0.00  BNP 22.5  --    Imaging: CT Chest w/ evidence of interstitial prominence bilaterally. Thickening of bilateral peripheral interlobular septa. Patchy bilateral ground-glass alveolar geographic infiltrates predominantly with peripheral distribution. Findings suspicious for nonspecific pneumonitis.   Medications:   Scheduled Medications: . cholecalciferol  1,000 Units Oral Daily  . enoxaparin  (LOVENOX) injection  40 mg Subcutaneous Daily  . pantoprazole  40 mg Oral Daily   PRN Medications: acetaminophen **OR** acetaminophen  Assessment/Plan: Pt is a 60 y.o. yo female with a PMHx of GERD, remote R breast CA who was admitted on 08/15/2016 with symptoms of SOB, which was determined to be secondary to nitrofurantoin pneumonitis.  Drug-induced pneumonitis with hypoxia, Likely nitrofurantoin exposure-related, clinically very stable with exception for desat with ambulation.  -- Discontinue nitrofurantoin -- Pulmonology w/o need for f/u -- will DC w/ short course of steroid taper  Length of Stay: 0 day(s) Dispo: Anticipated discharge today.  Holley Raring, MD Pager: [phone] (7AM-5PM) 08/16/2016, 12:22 PM

## 2016-08-17 ENCOUNTER — Other Ambulatory Visit: Payer: Self-pay | Admitting: Oncology

## 2016-08-17 DIAGNOSIS — Z1231 Encounter for screening mammogram for malignant neoplasm of breast: Secondary | ICD-10-CM

## 2016-10-07 ENCOUNTER — Inpatient Hospital Stay: Payer: BLUE CROSS/BLUE SHIELD | Admitting: Emergency Medicine

## 2017-01-28 ENCOUNTER — Other Ambulatory Visit (INDEPENDENT_AMBULATORY_CARE_PROVIDER_SITE_OTHER): Payer: Self-pay | Admitting: Internal Medicine

## 2017-01-30 NOTE — Telephone Encounter (Signed)
Patient will need office visit prior to next refill. 

## 2017-02-16 ENCOUNTER — Encounter (INDEPENDENT_AMBULATORY_CARE_PROVIDER_SITE_OTHER): Payer: Self-pay | Admitting: Internal Medicine

## 2017-02-16 ENCOUNTER — Encounter (INDEPENDENT_AMBULATORY_CARE_PROVIDER_SITE_OTHER): Payer: Self-pay

## 2017-02-16 NOTE — Telephone Encounter (Signed)
Patient was given an appointment for 03/16/17 at 2:45pm with Deberah Castle, NP.  A letter was mailed to the patient.

## 2017-03-16 ENCOUNTER — Ambulatory Visit (INDEPENDENT_AMBULATORY_CARE_PROVIDER_SITE_OTHER): Payer: BLUE CROSS/BLUE SHIELD | Admitting: Internal Medicine

## 2017-04-12 ENCOUNTER — Ambulatory Visit
Admission: RE | Admit: 2017-04-12 | Discharge: 2017-04-12 | Disposition: A | Discharge status: Home / Self Care | Payer: BLUE CROSS/BLUE SHIELD | Source: Ambulatory Visit | Attending: Oncology | Admitting: Oncology

## 2017-04-12 DIAGNOSIS — Z1231 Encounter for screening mammogram for malignant neoplasm of breast: Secondary | ICD-10-CM

## 2017-04-12 HISTORY — DX: Malignant neoplasm of unspecified site of unspecified female breast: C50.919

## 2017-05-04 ENCOUNTER — Ambulatory Visit (INDEPENDENT_AMBULATORY_CARE_PROVIDER_SITE_OTHER): Payer: BLUE CROSS/BLUE SHIELD | Admitting: Urology

## 2017-05-04 DIAGNOSIS — N3020 Other chronic cystitis without hematuria: Secondary | ICD-10-CM | POA: Diagnosis not present

## 2017-05-25 ENCOUNTER — Ambulatory Visit: Payer: BLUE CROSS/BLUE SHIELD | Admitting: Family Medicine

## 2017-06-07 ENCOUNTER — Encounter (INDEPENDENT_AMBULATORY_CARE_PROVIDER_SITE_OTHER): Payer: Self-pay

## 2017-06-07 ENCOUNTER — Encounter (INDEPENDENT_AMBULATORY_CARE_PROVIDER_SITE_OTHER): Payer: Self-pay | Admitting: Internal Medicine

## 2017-06-07 ENCOUNTER — Ambulatory Visit (INDEPENDENT_AMBULATORY_CARE_PROVIDER_SITE_OTHER): Payer: BLUE CROSS/BLUE SHIELD | Admitting: Internal Medicine

## 2017-06-07 VITALS — BP 104/80 | HR 64 | Temp 98.1°F | Ht 67.0 in | Wt 149.0 lb

## 2017-06-07 DIAGNOSIS — K219 Gastro-esophageal reflux disease without esophagitis: Secondary | ICD-10-CM | POA: Diagnosis not present

## 2017-06-07 MED ORDER — OMEPRAZOLE 20 MG PO CPDR: 20.0000 mg | DELAYED_RELEASE_CAPSULE | Freq: Every day | ORAL | 11 refills | Status: DC

## 2017-06-07 NOTE — Patient Instructions (Signed)
OV in 1 year.  

## 2017-06-07 NOTE — Progress Notes (Signed)
   Subjective:    Patient ID: [name], female    DOB: [birth year], 61 y.o.   MRN: [id number]  HPI Here today for f/u.  Hx of GERD. Last seen in office 06/04/2014. Maintained on Omeprazole 20mg  daily. She tells me she is doing well. GERD controlled with Omeprazole. His appetite is good. No weight loss.  Usually has a BM daily. No melena or BRRB. No GI complaints.  Takes Keflex 250 mg daily for chronic UTIs.   Last coloscopy in 2015 (average risk).  Dr. Laural Golden: Small  polyp snared from distal sigmoid colon. Biopsy:Hyperplastic.   Hx of breast cancer in 2011.  Review of Systems Past Medical History:  Diagnosis Date  . Breast cancer ([place])   . Cancer ([place])   . GERD (gastroesophageal reflux disease)     Past Surgical History:  Procedure Laterality Date  . ABDOMINAL HYSTERECTOMY  August 2010  . BREAST LUMPECTOMY Right   . BREAST SURGERY  2010   Breast cancer  . COLONOSCOPY N/A 11/07/2014   Procedure: COLONOSCOPY;  Surgeon: Rogene Houston, MD;  Location: [facility];  Service: Endoscopy;  Laterality: N/A;  1200    Allergies  Allergen Reactions  . Macrobid [Nitrofurantoin Macrocrystal]     Breathing problems. 02 level dropped    Current Outpatient Prescriptions on File Prior to Visit  Medication Sig Dispense Refill  . Calcium-Phosphorus-Vitamin D (CITRACAL CALCIUM GUMMIES PO) Take 2 each by mouth daily. 2 tablet daily     . cholecalciferol (VITAMIN D) 1000 units tablet Take 1 tablet by mouth daily.    . Multiple Vitamin (MULTIVITAMIN WITH MINERALS) TABS tablet Take 1 tablet by mouth daily.    . Omega-3 Fatty Acids (FISH OIL PO) Take 1 capsule by mouth daily. 1 tablet    . omeprazole (PRILOSEC) 20 MG capsule TAKE 1 CAPSULE (20 MG TOTAL) BY MOUTH DAILY. 30 capsule 2   No current facility-administered medications on file prior to visit.         Objective:   Physical Exam Blood pressure 104/80, pulse 64, temperature 98.1 F (36.7 C), height 5\' 7"  (1.702 m), weight 149 lb  (67.6 kg). Alert and oriented. Skin warm and dry. Oral mucosa is moist.   . Sclera anicteric, conjunctivae is pink. Thyroid not enlarged. No cervical lymphadenopathy. Lungs clear. Heart regular rate and rhythm.  Abdomen is soft. Bowel sounds are positive. No hepatomegaly. No abdominal masses felt. No tenderness.  No edema to lower extremities. Patient is alert and oriented.         Assessment & Plan:  GERD. Controlled with Omeprazole. No GI complaints. OV in 1 year.

## 2017-06-21 ENCOUNTER — Ambulatory Visit: Payer: BLUE CROSS/BLUE SHIELD | Admitting: Family Medicine

## 2017-07-27 ENCOUNTER — Ambulatory Visit (INDEPENDENT_AMBULATORY_CARE_PROVIDER_SITE_OTHER): Payer: Commercial Managed Care - PPO | Admitting: Urology

## 2017-07-27 ENCOUNTER — Other Ambulatory Visit (HOSPITAL_COMMUNITY)
Admission: RE | Admit: 2017-07-27 | Discharge: 2017-07-27 | Disposition: A | Discharge status: Home / Self Care | Payer: Commercial Managed Care - PPO | Source: Other Acute Inpatient Hospital | Attending: Urology | Admitting: Urology

## 2017-07-27 DIAGNOSIS — N3020 Other chronic cystitis without hematuria: Secondary | ICD-10-CM

## 2017-07-27 DIAGNOSIS — R300 Dysuria: Secondary | ICD-10-CM | POA: Diagnosis not present

## 2017-07-29 ENCOUNTER — Other Ambulatory Visit: Payer: Self-pay | Admitting: Urology

## 2017-07-29 DIAGNOSIS — N3020 Other chronic cystitis without hematuria: Secondary | ICD-10-CM

## 2017-07-30 LAB — URINE CULTURE

## 2017-08-09 ENCOUNTER — Other Ambulatory Visit (HOSPITAL_COMMUNITY)
Admission: RE | Admit: 2017-08-09 | Discharge: 2017-08-09 | Disposition: A | Discharge status: Home / Self Care | Payer: Commercial Managed Care - PPO | Source: Ambulatory Visit | Attending: Urology | Admitting: Urology

## 2017-08-09 ENCOUNTER — Ambulatory Visit (INDEPENDENT_AMBULATORY_CARE_PROVIDER_SITE_OTHER): Payer: Commercial Managed Care - PPO | Admitting: Urology

## 2017-08-09 DIAGNOSIS — N3020 Other chronic cystitis without hematuria: Secondary | ICD-10-CM | POA: Insufficient documentation

## 2017-08-12 LAB — URINE CULTURE

## 2017-08-17 ENCOUNTER — Ambulatory Visit (INDEPENDENT_AMBULATORY_CARE_PROVIDER_SITE_OTHER): Payer: Commercial Managed Care - PPO | Admitting: Urology

## 2017-08-17 ENCOUNTER — Ambulatory Visit (HOSPITAL_COMMUNITY): Payer: Commercial Managed Care - PPO

## 2017-08-17 ENCOUNTER — Ambulatory Visit (HOSPITAL_COMMUNITY)
Admission: RE | Admit: 2017-08-17 | Discharge: 2017-08-17 | Disposition: A | Discharge status: Home / Self Care | Payer: Commercial Managed Care - PPO | Source: Ambulatory Visit | Attending: Urology | Admitting: Urology

## 2017-08-17 DIAGNOSIS — N3020 Other chronic cystitis without hematuria: Secondary | ICD-10-CM | POA: Insufficient documentation

## 2018-01-31 ENCOUNTER — Other Ambulatory Visit: Payer: Self-pay | Admitting: Oncology

## 2018-01-31 DIAGNOSIS — Z1231 Encounter for screening mammogram for malignant neoplasm of breast: Secondary | ICD-10-CM

## 2018-04-13 ENCOUNTER — Ambulatory Visit
Admission: RE | Admit: 2018-04-13 | Discharge: 2018-04-13 | Disposition: A | Discharge status: Home / Self Care | Payer: Commercial Managed Care - PPO | Source: Ambulatory Visit | Attending: Oncology | Admitting: Oncology

## 2018-04-13 DIAGNOSIS — Z1231 Encounter for screening mammogram for malignant neoplasm of breast: Secondary | ICD-10-CM

## 2018-04-22 IMAGING — MG DIGITAL SCREENING BILATERAL MAMMOGRAM WITH TOMO AND CAD
8 series · 9 of 24 positions shown · non-contrast
Comparison: Previous exam(s).

CLINICAL DATA: Screening. History of RIGHT breast cancer in 5070
status post lumpectomy.

EXAM:
DIGITAL SCREENING BILATERAL MAMMOGRAM WITH TOMO AND CAD

[R MLO synth-2D]
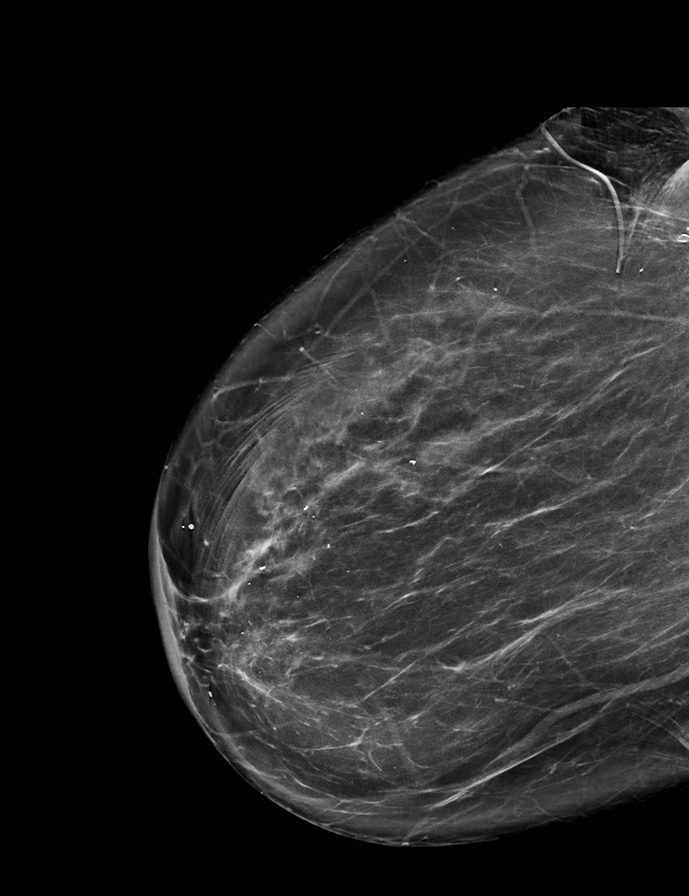

[L MLO synth-2D]
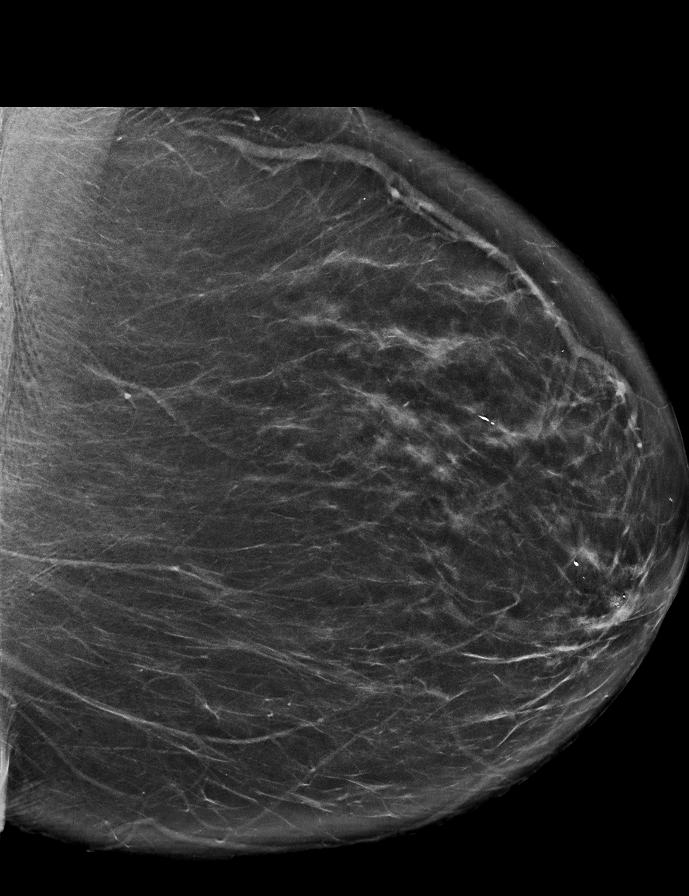

[R CC synth-2D]
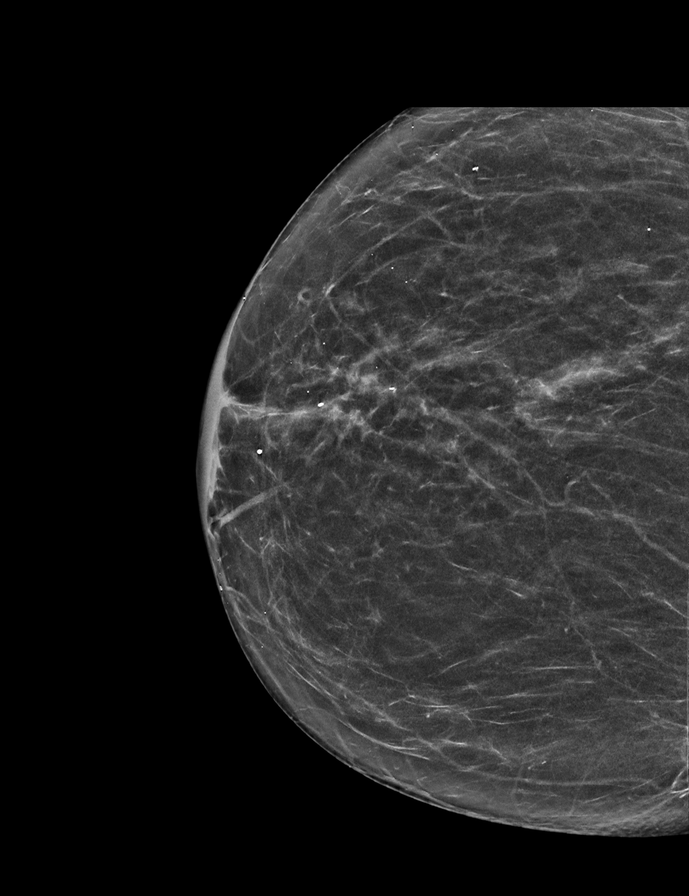

[L CC synth-2D]
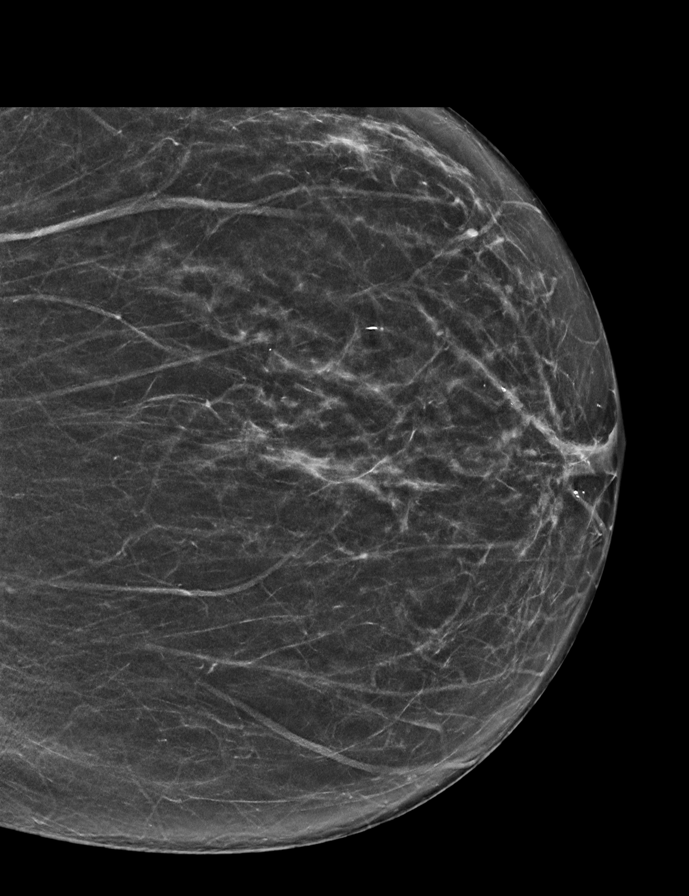

[L CC tomo · 2 of 60 frames shown]
[frame 20/60]
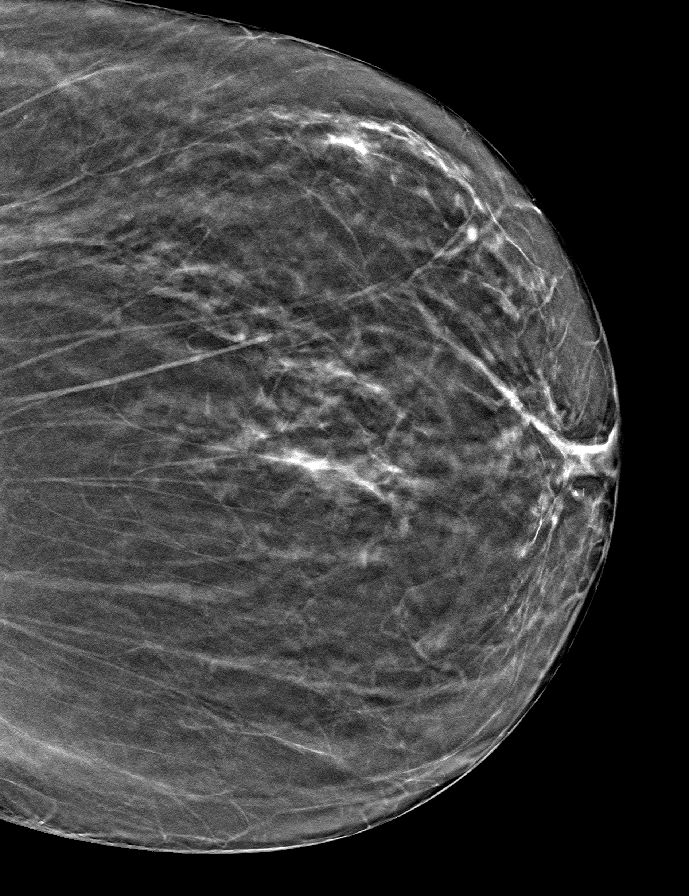
[frame 31/60]
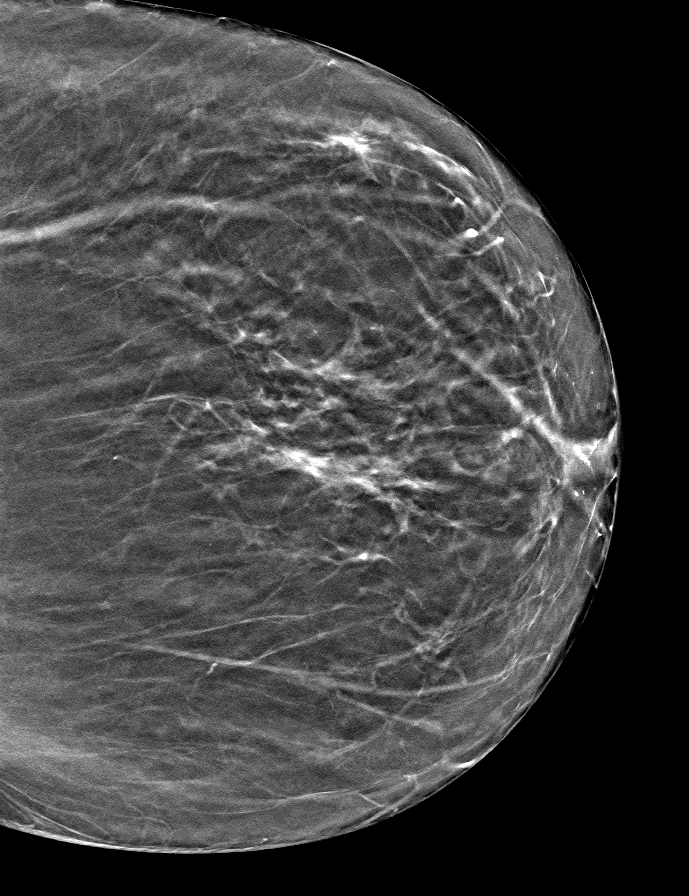

[L MLO tomo · tomo slice 39/78.0]
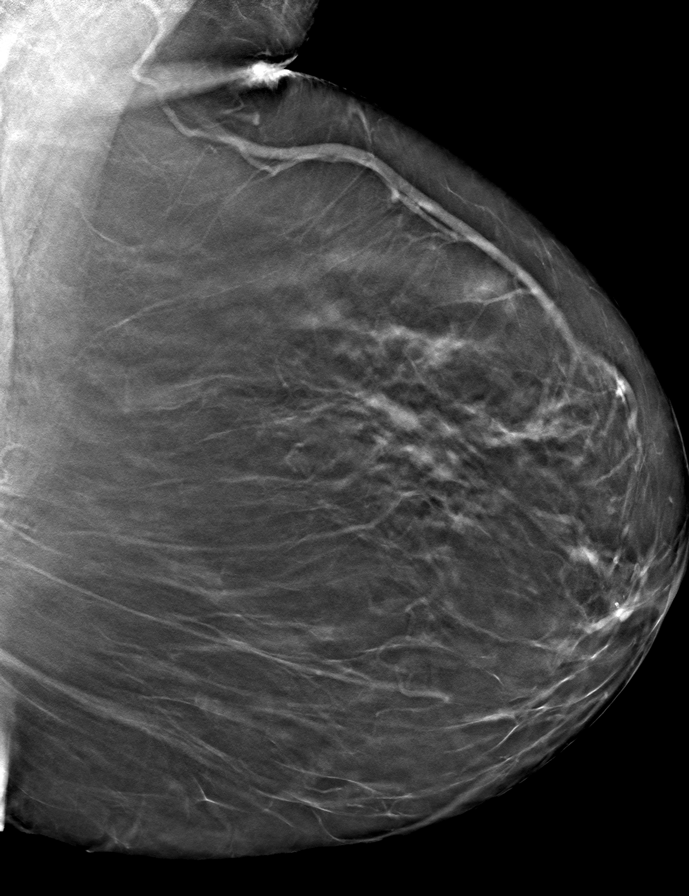

[R MLO tomo · tomo slice 39/78.0]
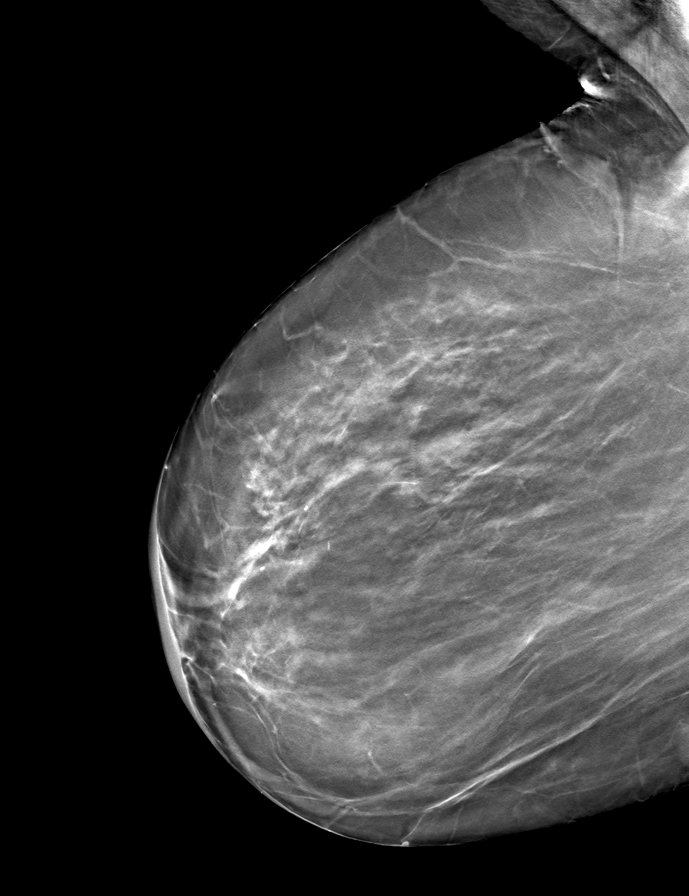

[R CC tomo · tomo slice 31/61.0]
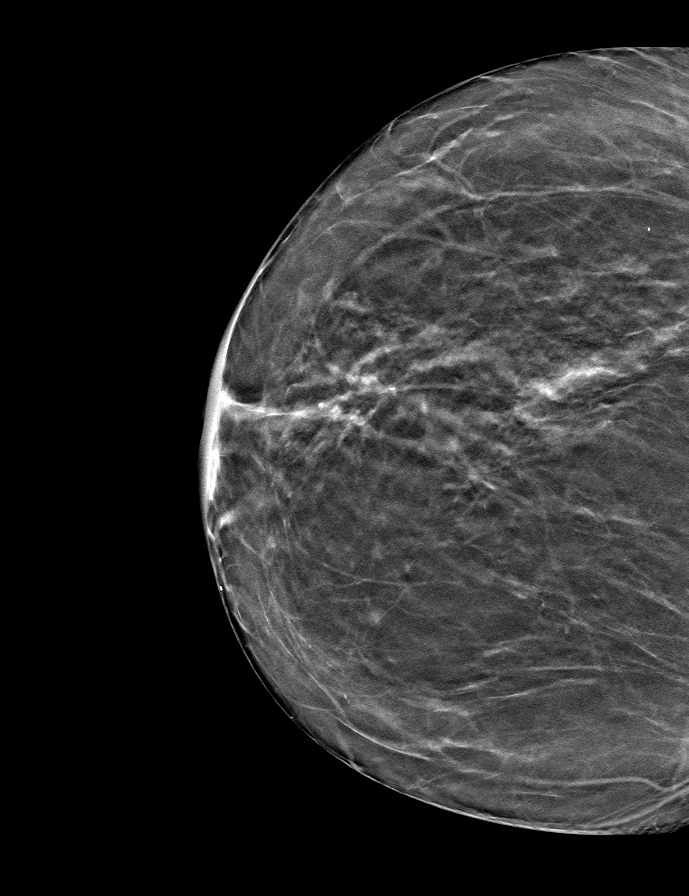

[9 of 24 positions shown; findings below may reference images not displayed]

ACR Breast Density Category b: There are scattered areas of
fibroglandular density.
FINDINGS: There are no findings suspicious for malignancy. Images were
processed with CAD.
IMPRESSION: No mammographic evidence of malignancy. A result letter of this
screening mammogram will be mailed directly to the patient.

RECOMMENDATION:
Screening mammogram in one year. (Code:4C-N-335)

BI-RADS CATEGORY  1: Negative.

## 2018-05-13 ENCOUNTER — Emergency Department (HOSPITAL_COMMUNITY): Payer: Commercial Managed Care - PPO

## 2018-05-13 ENCOUNTER — Emergency Department (HOSPITAL_COMMUNITY)
Admission: EM | Admit: 2018-05-13 | Discharge: 2018-05-14 | Disposition: A | Discharge status: Home / Self Care | Payer: Commercial Managed Care - PPO | Attending: Emergency Medicine | Admitting: Emergency Medicine

## 2018-05-13 ENCOUNTER — Encounter (HOSPITAL_COMMUNITY): Payer: Self-pay | Admitting: Emergency Medicine

## 2018-05-13 ENCOUNTER — Other Ambulatory Visit: Payer: Self-pay

## 2018-05-13 DIAGNOSIS — R0602 Shortness of breath: Secondary | ICD-10-CM | POA: Diagnosis not present

## 2018-05-13 DIAGNOSIS — R072 Precordial pain: Secondary | ICD-10-CM

## 2018-05-13 DIAGNOSIS — Z79899 Other long term (current) drug therapy: Secondary | ICD-10-CM | POA: Diagnosis not present

## 2018-05-13 LAB — I-STAT TROPONIN, ED: Troponin i, poc: 0 ng/mL (ref 0.00–0.08)

## 2018-05-13 LAB — CBC
HEMATOCRIT: 40 % (ref 36.0–46.0)
Hemoglobin: 13.1 g/dL (ref 12.0–15.0)
MCH: 30.4 pg (ref 26.0–34.0)
MCHC: 32.8 g/dL (ref 30.0–36.0)
MCV: 92.8 fL (ref 78.0–100.0)
Platelets: 196 10*3/uL (ref 150–400)
RBC: 4.31 MIL/uL (ref 3.87–5.11)
RDW: 12.6 % (ref 11.5–15.5)
WBC: 7.8 10*3/uL (ref 4.0–10.5)

## 2018-05-13 LAB — BASIC METABOLIC PANEL WITH GFR
Anion gap: 9 (ref 5–15)
BUN: 19 mg/dL (ref 6–20)
CO2: 29 mmol/L (ref 22–32)
Calcium: 9.8 mg/dL (ref 8.9–10.3)
Chloride: 102 mmol/L (ref 101–111)
Creatinine, Ser: 0.71 mg/dL (ref 0.44–1.00)
GFR calc Af Amer: >60 mL/min
GFR calc non Af Amer: >60 mL/min
Glucose, Bld: 105 mg/dL — ABNORMAL HIGH (ref 65–99)
Potassium: 3.8 mmol/L (ref 3.5–5.1)
Sodium: 140 mmol/L (ref 135–145)

## 2018-05-13 NOTE — ED Triage Notes (Signed)
Pt has had increasing episodes of chest pain/pressure that moves up into her throat.  States she was seen here in the past for this and it was an allergic rxn to antibiotics for UTI.  She is currently on amoxicillin.  Increase shob and increase shob with exertion.  This has been going on for about a week.

## 2018-05-14 MED ORDER — ALBUTEROL SULFATE HFA 108 (90 BASE) MCG/ACT IN AERS
2.0000 | INHALATION_SPRAY | Freq: Once | RESPIRATORY_TRACT | Status: AC
  Administered 2018-05-14: 2 via RESPIRATORY_TRACT
  Filled 2018-05-14: qty 6.7

## 2018-05-14 NOTE — ED Notes (Signed)
Pt ambulated with pulse ox around nurses station and down hall. O2 saturations remained at 99 to 100%

## 2018-05-14 NOTE — ED Provider Notes (Signed)
[facility] EMERGENCY DEPARTMENT Provider Note   CSN: [id number] Arrival date & time: 05/13/18  2035     History   Chief Complaint Chief Complaint  Patient presents with  . Chest Pain    HPI [name] is a 62 y.o. female.  The history is provided by the patient.  Chest Pain   This is a new problem. The current episode started more than 1 week ago. The problem occurs daily. The problem has not changed since onset.The pain is present in the substernal region. Associated symptoms include cough and shortness of breath. Pertinent negatives include no fever, no hemoptysis and no lower extremity edema. She has tried nothing for the symptoms.  Patient with history of breast cancer in remission, GERD, drug-induced pneumonitis, presents with chest pain, shortness of breath and cough for the past month. She reports for several weeks she has had intermittent episodes of feeling short of breath.  She also reports that she will cough at times and have some pain.  No hemoptysis.  She will occasionally have some exertional chest tightness and shortness of breath none at this time. No fevers or vomiting. She reports in 2017 she had problems with her lungs after taking nitrofurantoin.  She has to take antibiotics frequently for UTI, but recently she is been on amoxicillin but does admit to taking small doses of nitrofurantoin in the past month Denies any other signs of allergic reaction, no oral or tongue swelling  No history of CAD/PE  Past Medical History:  Diagnosis Date  . Breast cancer ([place])   . Cancer ([place])   . GERD (gastroesophageal reflux disease)     Patient Active Problem List   Diagnosis Date Noted  . Hypoxia 08/15/2016  . Drug-induced pneumonitis 08/15/2016  . GERD (gastroesophageal reflux disease) 06/04/2014    Past Surgical History:  Procedure Laterality Date  . ABDOMINAL HYSTERECTOMY  August 2010  . BREAST LUMPECTOMY Right   . BREAST SURGERY  2010     Breast cancer  . COLONOSCOPY N/A 11/07/2014   Procedure: COLONOSCOPY;  Surgeon: Rogene Houston, MD;  Location: [facility];  Service: Endoscopy;  Laterality: N/A;  1200     OB History   None      Home Medications    Prior to Admission medications   Medication Sig Start Date End Date Taking? Authorizing Provider  Calcium-Phosphorus-Vitamin D (CITRACAL CALCIUM GUMMIES PO) Take 2 tablets by mouth daily.    Yes [provider]  cholecalciferol (VITAMIN D) 1000 units tablet Take 1 tablet by mouth daily.   Yes [provider]  Multiple Vitamin (MULTIVITAMIN WITH MINERALS) TABS tablet Take 1 tablet by mouth daily.   Yes [provider]  Omega-3 Fatty Acids (FISH OIL PO) Take 1 capsule by mouth daily.    Yes [provider]  omeprazole (PRILOSEC) 20 MG capsule Take 1 capsule (20 mg total) by mouth daily. 06/07/17  Yes Setzer, Terri L, NP  omeprazole (PRILOSEC) 20 MG capsule TAKE 1 CAPSULE (20 MG TOTAL) BY MOUTH DAILY. Patient not taking: Reported on 05/14/2018 01/30/17   Rogene Houston, MD    Family History No family history on file.  Social History Social History   Tobacco Use  . Smoking status: Never Smoker  . Smokeless tobacco: Never Used  Substance Use Topics  . Alcohol use: No  . Drug use: No     Allergies   Macrobid [nitrofurantoin macrocrystal]   Review of Systems Review of Systems  Constitutional: Negative for fever.  HENT: Negative for trouble swallowing.   Respiratory: Positive for cough and shortness of breath. Negative for hemoptysis.   Cardiovascular: Positive for chest pain.  All other systems reviewed and are negative.    Physical Exam Updated Vital Signs BP 135/74   Pulse 63   Temp 98.3 F (36.8 C) (Oral)   Resp 18   Ht 1.702 m (5\' 7" )   Wt 58.5 kg (129 lb)   SpO2 100%   BMI 20.20 kg/m   Physical Exam CONSTITUTIONAL: Well developed/well nourished anxious HEAD: Normocephalic/atraumatic EYES:  EOMI/PERRL ENMT: Mucous membranes moist, no angioedema NECK: supple no meningeal signs SPINE/BACK:entire spine nontender CV: S1/S2 noted, no murmurs/rubs/gallops noted LUNGS: Lungs are clear to auscultation bilaterally, no apparent distress ABDOMEN: soft, nontender, no rebound or guarding, bowel sounds noted throughout abdomen GU:no cva tenderness NEURO: Pt is awake/alert/appropriate, moves all extremitiesx4.  No facial droop.   EXTREMITIES: pulses normal/equal, full ROM, no lower extremity edema or tenderness SKIN: warm, color normal PSYCH: no abnormalities of mood noted, alert and oriented to situation   ED Treatments / Results  Labs (all labs ordered are listed, but only abnormal results are displayed) Labs Reviewed  BASIC METABOLIC PANEL - Abnormal; Notable for the following components:      Result Value   Glucose, Bld 105 (*)    All other components within normal limits  CBC  I-STAT TROPONIN, ED    EKG EKG Interpretation  Date/Time:  Saturday May 13 2018 20:43:46 EDT Ventricular Rate:  75 PR Interval:  166 QRS Duration: 86 QT Interval:  370 QTC Calculation: 413 R Axis:   54 Text Interpretation:  Normal sinus rhythm Possible Left atrial enlargement Septal infarct , age undetermined Abnormal ECG changed from prior T wave inversion Confirmed by Ripley Fraise [phone]) on 05/14/2018 1:27:59 AM   Radiology Dg Chest 2 View  Result Date: 05/13/2018 CLINICAL DATA:  Acute shortness of breath and chest pain. EXAM: CHEST - 2 VIEW COMPARISON:  08/15/2016 and prior studies. FINDINGS: The cardiomediastinal silhouette is unremarkable. Mild interstitial prominence is present. There is no evidence of focal airspace disease, pulmonary edema, suspicious pulmonary nodule/mass, pleural effusion, or pneumothorax. No acute bony abnormalities are identified. IMPRESSION: No active cardiopulmonary disease. Electronically Signed   By: Margarette Canada M.D.   On: 05/13/2018 21:48     Procedures Procedures   Medications Ordered in ED Medications  albuterol (PROVENTIL HFA;VENTOLIN HFA) 108 (90 Base) MCG/ACT inhaler 2 puff (has no administration in time range)     Initial Impression / Assessment and Plan / ED Course  I have reviewed the triage vital signs and the nursing notes.  Pertinent labs & imaging results that were available during my care of the patient were reviewed by me and considered in my medical decision making (see chart for details).     Patient admits symptoms been ongoing for several weeks.  Her main concern is her intermittent shortness of breath, and when she takes a deep breath she will have a brief cough with pain. 2017 she had a episode of drug-induced pneumonitis likely due to nitrofurantoin.  She thought that she took a few doses recently that triggered another episode.  I reviewed her x-ray tonight and there is no acute findings.  She walked around the ER in no distress.  No hypoxia on ambulation.  No tachycardia noted.  She is overall well-appearing and no chest pain reported at all She agreed to try an albuterol inhaler, advise close  follow-up with her PCP.  We discussed strict ER return precautions.  My suspicion for PE/ACS/dissection is low Final Clinical Impressions(s) / ED Diagnoses   Final diagnoses:  Precordial pain  Shortness of breath    ED Discharge Orders    None       Ripley Fraise, MD 05/14/18 [phone]

## 2018-09-05 ENCOUNTER — Encounter (INDEPENDENT_AMBULATORY_CARE_PROVIDER_SITE_OTHER): Payer: Self-pay | Admitting: Internal Medicine

## 2018-09-05 ENCOUNTER — Encounter (INDEPENDENT_AMBULATORY_CARE_PROVIDER_SITE_OTHER): Payer: Self-pay | Admitting: *Deleted

## 2018-09-05 ENCOUNTER — Other Ambulatory Visit (INDEPENDENT_AMBULATORY_CARE_PROVIDER_SITE_OTHER): Payer: Self-pay | Admitting: Internal Medicine

## 2018-09-05 ENCOUNTER — Ambulatory Visit (INDEPENDENT_AMBULATORY_CARE_PROVIDER_SITE_OTHER): Payer: Commercial Managed Care - PPO | Admitting: Internal Medicine

## 2018-09-05 VITALS — BP 106/66 | HR 62 | Temp 97.5°F | Resp 18 | Ht 67.0 in | Wt 134.1 lb

## 2018-09-05 DIAGNOSIS — K219 Gastro-esophageal reflux disease without esophagitis: Secondary | ICD-10-CM

## 2018-09-05 MED ORDER — FAMOTIDINE 20 MG PO TABS: 20.0000 mg | ORAL_TABLET | Freq: Every day | ORAL | Status: DC

## 2018-09-05 NOTE — Progress Notes (Signed)
Presenting complaint;  Follow-up for GERD.    Subjective:  Patient is 62 year old Caucasian female who is here for scheduled visit.  She was last seen in June 2018 and was doing well.  She feels her GERD symptoms are not well controlled.  She has been having chest heaviness usually in the evening for the last 4 to 5 months.  She had an echocardiogram and a chest film and both of these were normal.  She also has noted discomfort in upper sternal area when she takes a deep breath.  She has a sense of raw feeling.  She has noted dysphagia with pills and has to be very careful with meats.  She has not had an episode where she had to regurgitate food for relief.  She has lost 15 pounds in the last 15 months.  She says while she is watching her diet she is not trying to lose weight.  She also complains of intermittent retrosternal burning and regurgitation like she had 4:00 this morning.  She seemed to do better during the daytime.  She has been taking Pepto-Bismol with some relief.  She eats her supper no later than 6:00.  She goes to bed at 10 PM.  On her last visit the PPI dose was reduced from 40 to 20 mg daily.  She did not have any problems until about April this year.  She is active.  She walks 4-5 times each week.  Every time she walks 2 miles.  She denies abdominal pain melena or rectal bleeding.  Her bowels move daily.  She does not smoke cigarettes or drink alcohol. Last EGD was on 03/11/2003 revealing small sliding hiatal hernia and irregular GE junction.   Current Medications: Outpatient Encounter Medications as of 09/05/2018  Medication Sig  . Calcium-Phosphorus-Vitamin D (CITRACAL CALCIUM GUMMIES PO) Take 2 tablets by mouth daily.   . Cholecalciferol (VITAMIN D3) 2000 units capsule Take by mouth daily.   . Cyanocobalamin (VITAMIN B 12 PO) Take 500 mg by mouth daily.  . Multiple Vitamin (MULTIVITAMIN WITH MINERALS) TABS tablet Take 1 tablet by mouth daily.  . Omega-3 Fatty Acids (FISH OIL PO)  Take 1 capsule by mouth daily.   Marland Kitchen omeprazole (PRILOSEC) 20 MG capsule Take 1 capsule (20 mg total) by mouth daily.  Marland Kitchen OVER THE COUNTER MEDICATION 360 Cholesterol Capsule - patient states that she takes 1 daily.  . [DISCONTINUED] omeprazole (PRILOSEC) 20 MG capsule TAKE 1 CAPSULE (20 MG TOTAL) BY MOUTH DAILY.   No facility-administered encounter medications on file as of 09/05/2018.    Past Medical History:  Diagnosis Date  . Breast cancer ([place])   . Cancer ([place])   . GERD (gastroesophageal reflux disease)    Past Surgical History:  Procedure Laterality Date  . ABDOMINAL HYSTERECTOMY  August 2010  . BREAST LUMPECTOMY Right   . BREAST SURGERY  2010   Breast cancer  . COLONOSCOPY N/A 11/07/2014   Procedure: COLONOSCOPY;  Surgeon: Rogene Houston, MD;  Location: [facility];  Service: Endoscopy;  Laterality: N/A;  1200      Objective: Blood pressure 106/66, pulse 62, temperature (!) 97.5 F (36.4 C), temperature source Oral, resp. rate 18, height 5\' 7"  (1.702 m), weight 134 lb 1.6 oz (60.8 kg). Patient is alert and in no acute distress. Conjunctiva is pink. Sclera is nonicteric Oropharyngeal mucosa is normal. No neck masses or thyromegaly noted. Cardiac exam with regular rhythm normal S1 and S2. No murmur or gallop noted. Lungs are  clear to auscultation. Abdomen is symmetrical soft and nontender with organomegaly or masses. No LE edema or clubbing noted.    Assessment:  #1.  Chronic GERD.  Her symptoms are not controlled with low-dose PPI and antireflux measures.  May be she needs to be on a different PPI.  Upper GI tract needs to be evaluated to make sure she does not have erosive esophagitis or Barrett's.  She is also having dysphagia and may benefit from esophageal dilation.   Plan:  Patient advised to take famotidine 20 mg p.o. every morning and take omeprazole 20 mg 30 minutes before evening meal daily. We will schedule patient for esophagogastroduodenoscopy and  possible esophageal dilation in near future. Office visit in 1 year or earlier if needed

## 2018-09-05 NOTE — Patient Instructions (Addendum)
Esophagogastroduodenoscopy to be scheduled. Take famotidine or Pepcid OTC 20 mg 30 minutes before breakfast and take omeprazole 30 minutes before evening meal.

## 2018-09-13 IMAGING — US US RENAL
1 series · 14 of 25 positions shown · non-contrast
Comparison: None.

CLINICAL DATA: Chronic cystitis

EXAM:
RENAL / URINARY TRACT ULTRASOUND COMPLETE

[Series 1: us renal · 0.23mm/px · 14 of 57 slices shown]
[im 1/57]
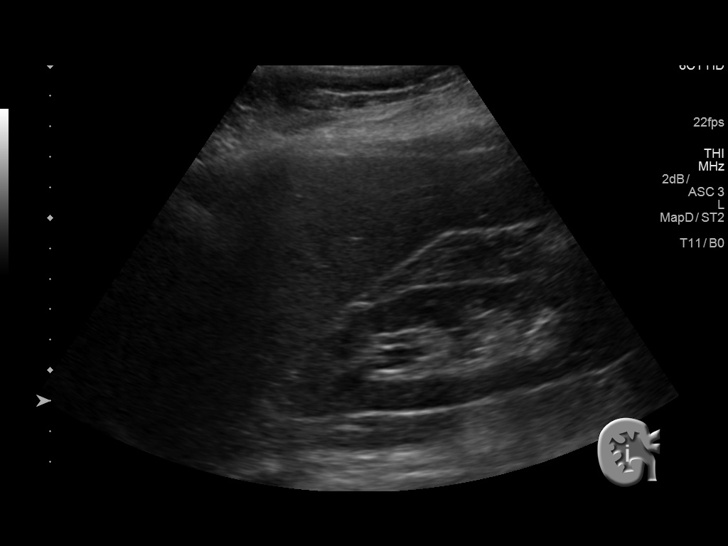
[im 5/57]
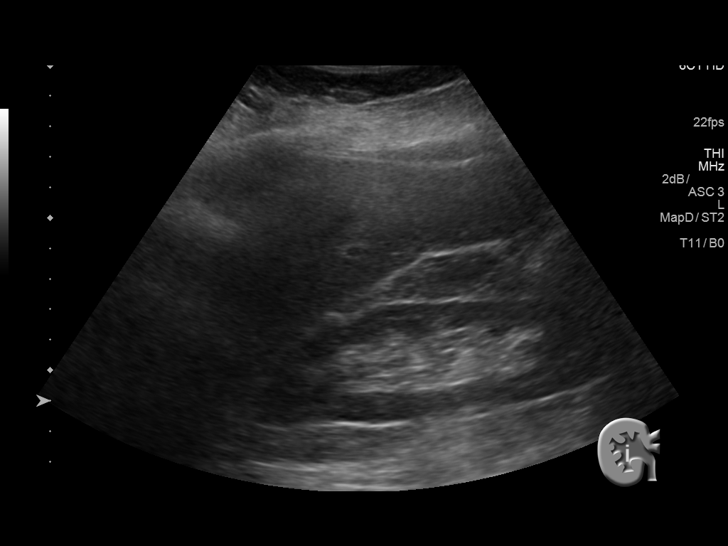
[im 10/57]
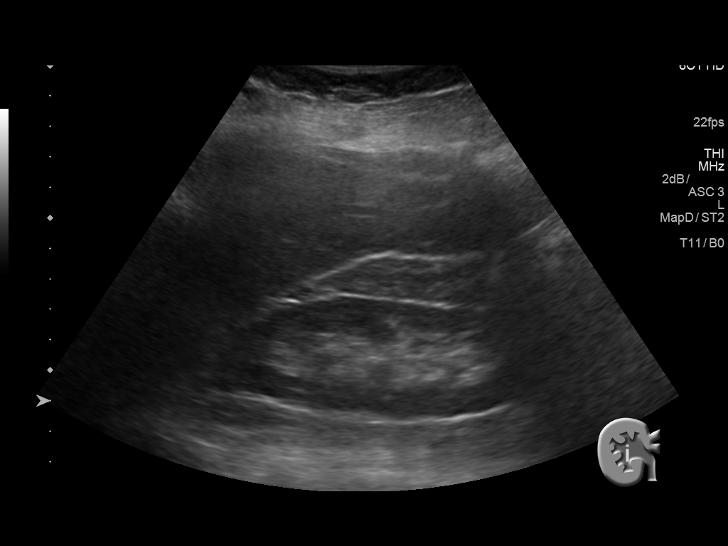
[im 15/57]
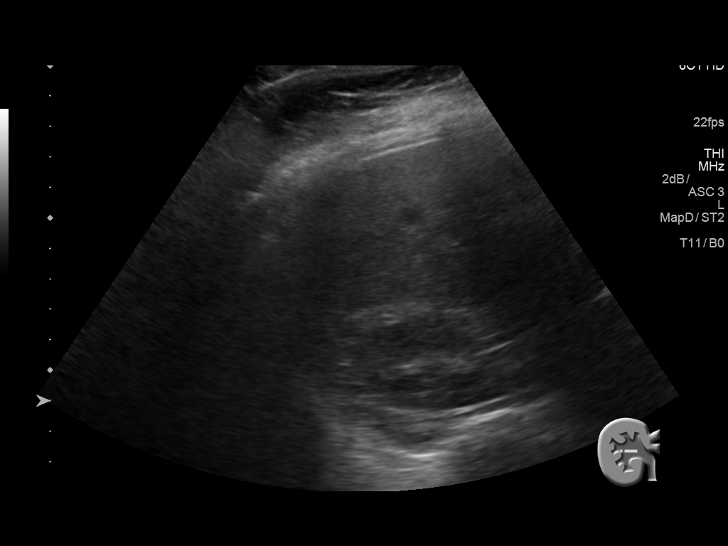
[im 19/57]
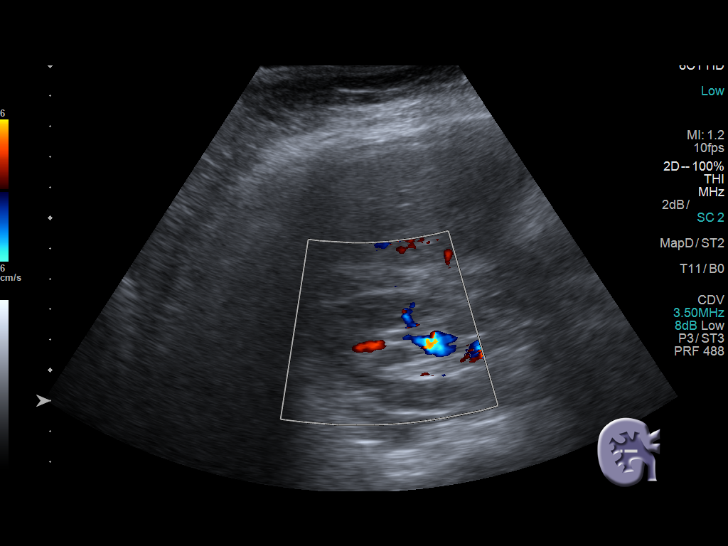
[im 22/57]
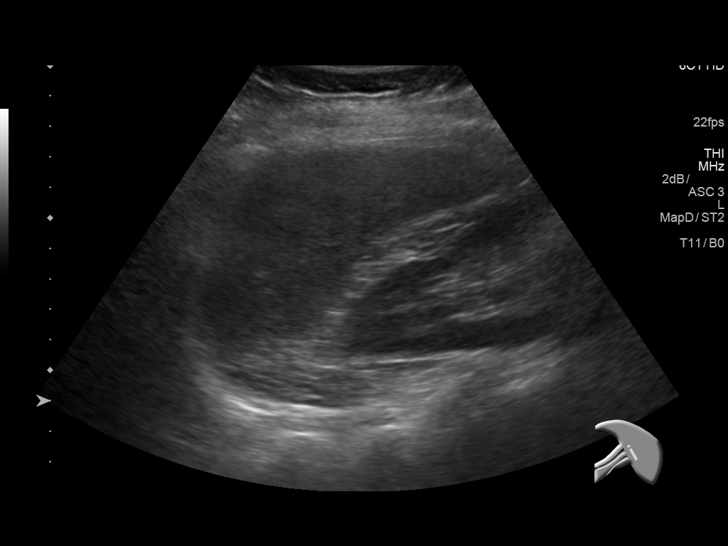
[im 26/57]
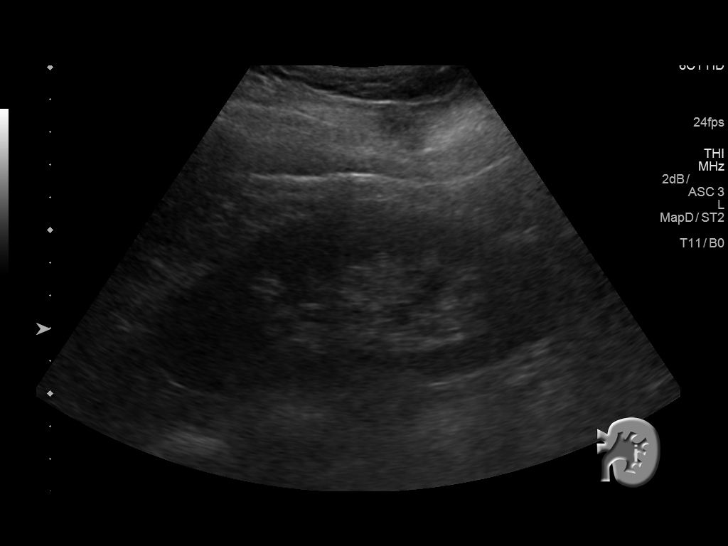
[im 31/57]
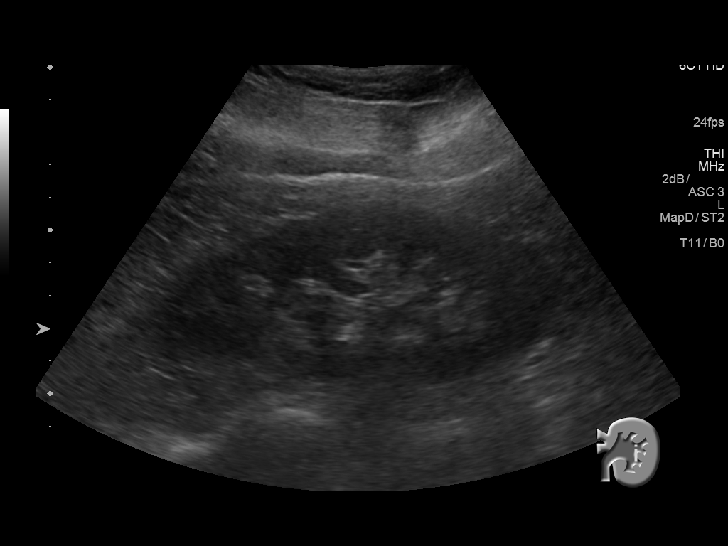
[im 36/57]
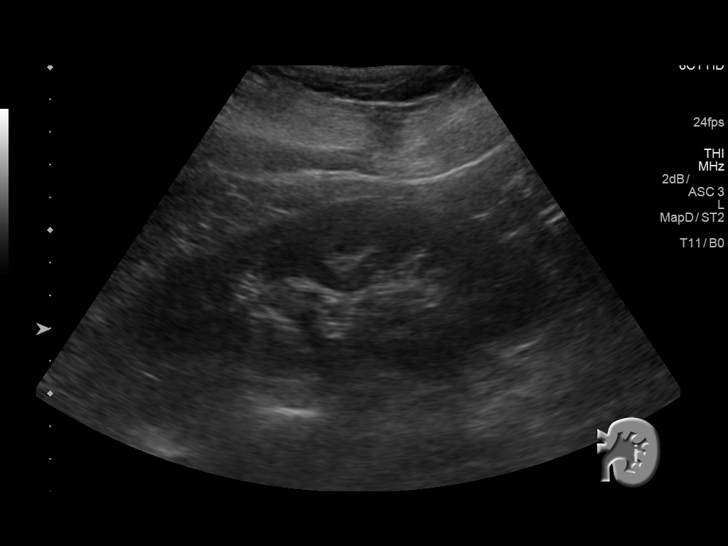
[im 38/57]
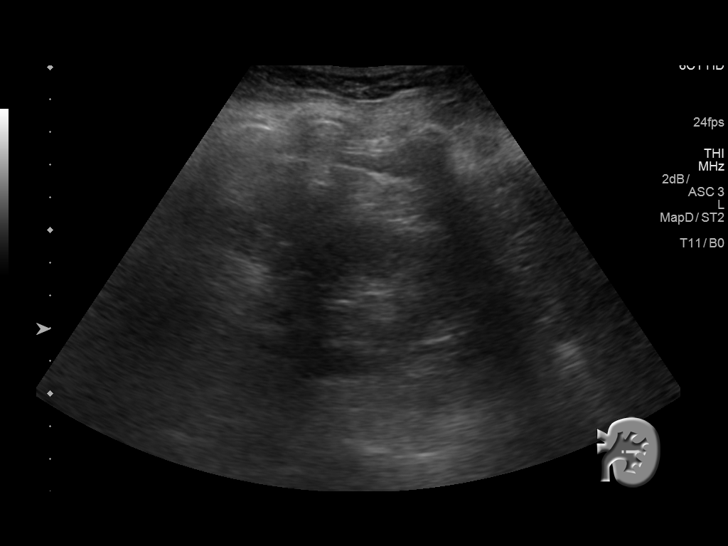
[im 43/57]
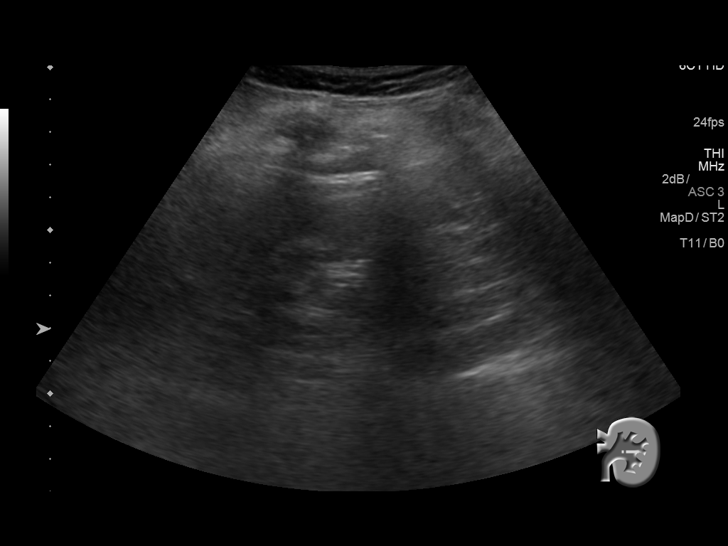
[im 47/57]
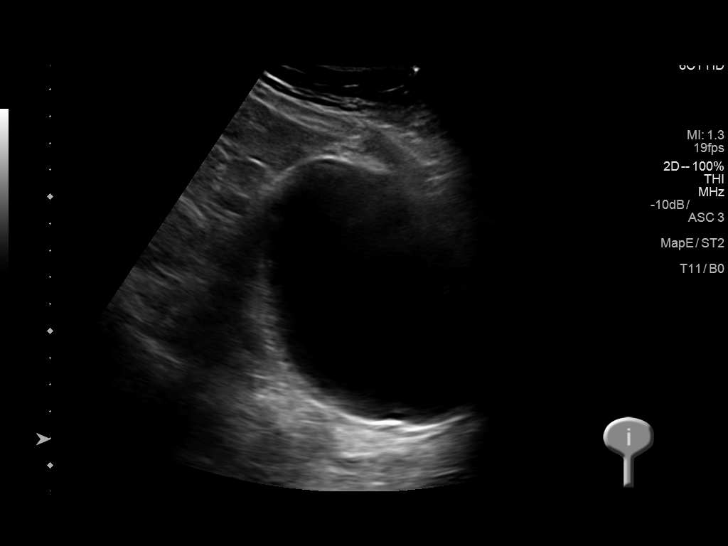
[im 52/57]
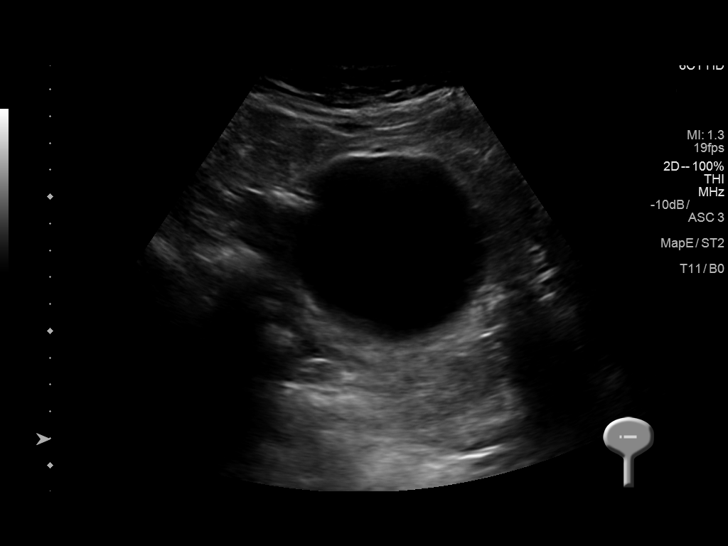
[im 57/57]
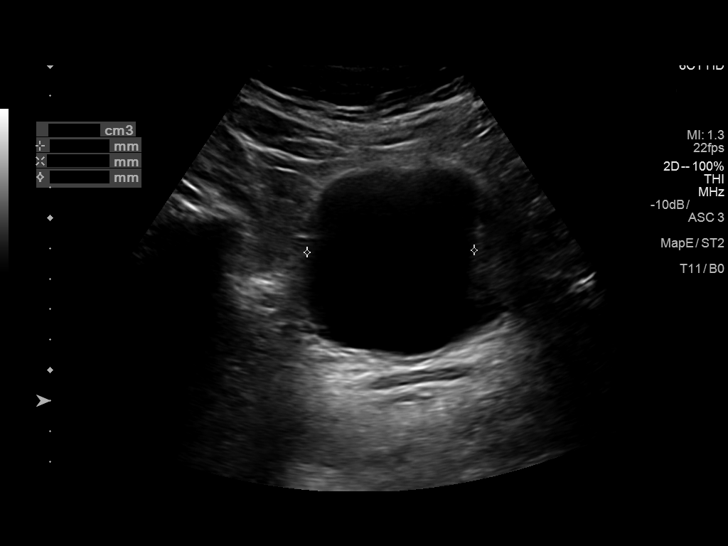

[14 of 25 positions shown; findings below may reference images not displayed]

FINDINGS: Right Kidney:

Length: 10.7 cm. Echogenicity and renal cortical thickness are
within normal limits. No mass, perinephric fluid, or hydronephrosis
visualized. No sonographically demonstrable calculus or
ureterectasis.

Left Kidney:

Length: 13.0 cm. Echogenicity and renal cortical thickness are
within normal limits. No mass, perinephric fluid, or hydronephrosis
visualized. No sonographically demonstrable calculus or
ureterectasis.

Bladder:

Appears normal for degree of bladder distention. Prevoid measured
volume is 351 mL. Postvoid volume measured at 71 mL.
IMPRESSION: 1. Left kidney larger than right kidney. Significance of this
finding uncertain. This finding potentially could indicate renal
artery stenosis on the right. In this regard, question whether
patient is hypertensive.

2.  Kidneys otherwise appear normal bilaterally.

3.  Relatively mild postvoid residual in urinary bladder.

## 2018-09-18 ENCOUNTER — Other Ambulatory Visit: Payer: Self-pay

## 2018-09-18 ENCOUNTER — Encounter (HOSPITAL_COMMUNITY): Payer: Self-pay | Admitting: Anesthesiology

## 2018-09-18 ENCOUNTER — Ambulatory Visit (HOSPITAL_COMMUNITY)
Admission: RE | Admit: 2018-09-18 | Discharge: 2018-09-18 | Disposition: A | Discharge status: Home / Self Care | Payer: Commercial Managed Care - PPO | Source: Ambulatory Visit | Attending: Internal Medicine | Admitting: Internal Medicine

## 2018-09-18 ENCOUNTER — Encounter (HOSPITAL_COMMUNITY): Payer: Self-pay | Admitting: *Deleted

## 2018-09-18 ENCOUNTER — Encounter (HOSPITAL_COMMUNITY)
Admission: RE | Disposition: A | Discharge status: Home / Self Care | Payer: Self-pay | Source: Ambulatory Visit | Attending: Internal Medicine

## 2018-09-18 DIAGNOSIS — K3189 Other diseases of stomach and duodenum: Secondary | ICD-10-CM | POA: Diagnosis not present

## 2018-09-18 DIAGNOSIS — Z79899 Other long term (current) drug therapy: Secondary | ICD-10-CM | POA: Insufficient documentation

## 2018-09-18 DIAGNOSIS — K219 Gastro-esophageal reflux disease without esophagitis: Secondary | ICD-10-CM

## 2018-09-18 DIAGNOSIS — Z881 Allergy status to other antibiotic agents status: Secondary | ICD-10-CM | POA: Insufficient documentation

## 2018-09-18 DIAGNOSIS — Z853 Personal history of malignant neoplasm of breast: Secondary | ICD-10-CM | POA: Insufficient documentation

## 2018-09-18 DIAGNOSIS — R1314 Dysphagia, pharyngoesophageal phase: Secondary | ICD-10-CM | POA: Insufficient documentation

## 2018-09-18 DIAGNOSIS — K228 Other specified diseases of esophagus: Secondary | ICD-10-CM | POA: Diagnosis not present

## 2018-09-18 DIAGNOSIS — K449 Diaphragmatic hernia without obstruction or gangrene: Secondary | ICD-10-CM | POA: Insufficient documentation

## 2018-09-18 HISTORY — PX: ESOPHAGOGASTRODUODENOSCOPY: SHX5428

## 2018-09-18 SURGERY — EGD (ESOPHAGOGASTRODUODENOSCOPY)
Anesthesia: Moderate Sedation

## 2018-09-18 MED ORDER — FAMOTIDINE 20 MG PO TABS: 20.0000 mg | ORAL_TABLET | Freq: Every evening | ORAL | Status: DC | PRN

## 2018-09-18 MED ORDER — SODIUM CHLORIDE 0.9 % IV SOLN
INTRAVENOUS | Status: DC
  Administered 2018-09-18: 12:00:00 via INTRAVENOUS

## 2018-09-18 MED ORDER — MEPERIDINE HCL 50 MG/ML IJ SOLN
INTRAMUSCULAR | Status: AC
  Filled 2018-09-18: qty 1

## 2018-09-18 MED ORDER — PANTOPRAZOLE SODIUM 40 MG PO TBEC: 40.0000 mg | DELAYED_RELEASE_TABLET | Freq: Every day | ORAL | 5 refills | Status: DC

## 2018-09-18 MED ORDER — LIDOCAINE VISCOUS HCL 2 % MT SOLN
OROMUCOSAL | Status: DC | PRN
  Administered 2018-09-18: 4 mL via OROMUCOSAL

## 2018-09-18 MED ORDER — MIDAZOLAM HCL 5 MG/5ML IJ SOLN
INTRAMUSCULAR | Status: DC | PRN
  Administered 2018-09-18 (×2): 2 mg via INTRAVENOUS
  Administered 2018-09-18: 1 mg via INTRAVENOUS

## 2018-09-18 MED ORDER — MEPERIDINE HCL 50 MG/ML IJ SOLN
INTRAMUSCULAR | Status: DC | PRN
  Administered 2018-09-18 (×2): 25 mg via INTRAVENOUS

## 2018-09-18 MED ORDER — MIDAZOLAM HCL 5 MG/5ML IJ SOLN
INTRAMUSCULAR | Status: AC
  Filled 2018-09-18: qty 10

## 2018-09-18 MED ORDER — LIDOCAINE VISCOUS HCL 2 % MT SOLN
OROMUCOSAL | Status: AC
  Filled 2018-09-18: qty 15

## 2018-09-18 MED ORDER — STERILE WATER FOR IRRIGATION IR SOLN
Status: DC | PRN
  Administered 2018-09-18: 12:00:00

## 2018-09-18 NOTE — Discharge Instructions (Signed)
Discontinue omeprazole. Given pantoprazole 40 mg by mouth 30 minutes before breakfast daily. Famotidine OTC 20 mg at the evening on as-needed basis for breakthrough heartburn. Resume usual diet. No driving for 24 hours. Please call office with progress report in 2 weeks.     Esophagogastroduodenoscopy, Care After Refer to this sheet in the next few weeks. These instructions provide you with information about caring for yourself after your procedure. Your health care provider may also give you more specific instructions. Your treatment has been planned according to current medical practices, but problems sometimes occur. Call your health care provider if you have any problems or questions after your procedure. What can I expect after the procedure? After the procedure, it is common to have:  A sore throat.  Nausea.  Bloating.  Dizziness.  Fatigue.  Follow these instructions at home:  Do not eat or drink anything until the numbing medicine (local anesthetic) has worn off and your gag reflex has returned. You will know that the local anesthetic has worn off when you can swallow comfortably.  Do not drive for 24 hours if you received a medicine to help you relax (sedative).  If your health care provider took a tissue sample for testing during the procedure, make sure to get your test results. This is your responsibility. Ask your health care provider or the department performing the test when your results will be ready.  Keep all follow-up visits as told by your health care provider. This is important. Contact a health care provider if:  You cannot stop coughing.  You are not urinating.  You are urinating less than usual. Get help right away if:  You have trouble swallowing.  You cannot eat or drink.  You have throat or chest pain that gets worse.  You are dizzy or light-headed.  You faint.  You have nausea or vomiting.  You have chills.  You have a fever.  You  have severe abdominal pain.  You have black, tarry, or bloody stools. This information is not intended to replace advice given to you by your health care provider. Make sure you discuss any questions you have with your health care provider. Document Released: 11/29/2012 Document Revised: 05/20/2016 Document Reviewed: 11/06/2015 Elsevier Interactive Patient Education  [employer].

## 2018-09-18 NOTE — Op Note (Signed)
[facility] Patient Name: [name] Procedure Date: 09/18/2018 11:09 AM MRN: [id number] Date of Birth: 1956-04-12 Attending MD: Hildred Laser , MD CSN: [id number] Age: 62 Admit Type: Inpatient Procedure:                Upper GI endoscopy Indications:              Esophageal dysphagia, Follow-up of                            gastro-esophageal reflux disease Providers:                Hildred Laser, MD, Otis Peak B. Gwenlyn Perking RN, RN, Randa Spike, Technician Referring MD:              Medicines:                Lidocaine spray, Meperidine 50 mg IV, Midazolam 5                            mg IV Complications:            No immediate complications. Estimated Blood Loss:     Estimated blood loss was minimal. Procedure:                Pre-Anesthesia Assessment:                           - Prior to the procedure, a History and Physical                            was performed, and patient medications and                            allergies were reviewed. The patient's tolerance of                            previous anesthesia was also reviewed. The risks                            and benefits of the procedure and the sedation                            options and risks were discussed with the patient.                            All questions were answered, and informed consent                            was obtained. Prior Anticoagulants: The patient has                            taken no previous anticoagulant or antiplatelet                            agents.  ASA Grade Assessment: I - A normal, healthy                            patient. After reviewing the risks and benefits,                            the patient was deemed in satisfactory condition to                            undergo the procedure.                           After obtaining informed consent, the endoscope was                            passed under direct vision. Throughout the                         procedure, the patient's blood pressure, pulse, and                            oxygen saturations were monitored continuously. The                            [id number]-H190 ([id number]) scope was introduced through the                            mouth, and advanced to the second part of duodenum.                            The upper GI endoscopy was accomplished without                            difficulty. The patient tolerated the procedure                            well. Scope In: 11:50:18 AM Scope Out: 12:01:51 PM Total Procedure Duration: 0 hours 11 minutes 33 seconds  Findings:      The examined esophagus was normal.      The Z-line was irregular and was found 37 cm from the incisors.      A 2 cm hiatal hernia was present.      A single erosion was found in the prepyloric region of the stomach.      Patchy mild mucosal changes characterized by erythema were found in the       gastric antrum.      The exam of the stomach was otherwise normal.      The duodenal bulb and second portion of the duodenum were normal.      No endoscopic abnormality was evident in the esophagus to explain the       patient's complaint of dysphagia. It was decided, however, to proceed       with dilation of the entire esophagus. The scope was withdrawn. Dilation       was performed with a Maloney dilator with no resistance at 48 Fr. The  dilation site was examined following endoscope reinsertion and showed no       change and no bleeding, mucosal tear or perforation.      small amount of blood noted in hypopharynx secondary mucosal injury due       to dilator tip.      A few small sessile polyps were found in the gastric body. Impression:               - Normal esophagus.                           - Z-line irregular, 37 cm from the incisors.                           - 2 cm hiatal hernia.                           - Erosive gastropathy.                           - Few small polyps at  gastric body. Appearence                            typical of hyperplastic polpys.These were left                            alone.                           - Normal duodenal bulb and second portion of the                            duodenum.                           - No endoscopic esophageal abnormality to explain                            patient's dysphagia. Esophagus dilated. Dilated.                           - No specimens collected. Moderate Sedation:      Moderate (conscious) sedation was administered by the endoscopy nurse       and supervised by the endoscopist. The following parameters were       monitored: oxygen saturation, heart rate, blood pressure, CO2       capnography and response to care. Total physician intraservice time was       15 minutes. Recommendation:           - Patient has a contact number available for                            emergencies. The signs and symptoms of potential                            delayed complications were discussed with the  patient. Return to normal activities tomorrow.                            Written discharge instructions were provided to the                            patient.                           - Resume previous diet today.                           - Continue present medications.                           - H.Pylori serology.                           - Telephone GI clinic in 2 weeks. Procedure Code(s):        --- Professional ---                           [phone], Esophagogastroduodenoscopy, flexible,                            transoral; diagnostic, including collection of                            specimen(s) by brushing or washing, when performed                            (separate procedure)                           43450, Dilation of esophagus, by unguided sound or                            bougie, single or multiple passes                           99152, 59, Moderate sedation  services provided by                            the same physician or other qualified health care                            professional performing the diagnostic or                            therapeutic service that the sedation supports,                            requiring the presence of an independent trained                            observer to assist in the monitoring of the  patient's level of consciousness and physiological                            status; initial 15 minutes of intraservice time,                            patient age 68 years or older Diagnosis Code(s):        --- Professional ---                           K22.8, Other specified diseases of esophagus                           K44.9, Diaphragmatic hernia without obstruction or                            gangrene                           K31.89, Other diseases of stomach and duodenum                           R13.14, Dysphagia, pharyngoesophageal phase                           K21.9, Gastro-esophageal reflux disease without                            esophagitis CPT copyright 2017 American Medical Association. All rights reserved. The codes documented in this report are preliminary and upon coder review may  be revised to meet current compliance requirements. Hildred Laser, MD Hildred Laser, MD 09/18/2018 12:27:35 PM This report has been signed electronically. Number of Addenda: 0

## 2018-09-18 NOTE — H&P (Signed)
[name] is an 62 y.o. female.   Chief Complaint: Patient is here for EGD and ED. HPI: Patient is 63 year old Caucasian female with several year history of GERD who presents with recurrent heartburn and nocturnal regurgitation.  She is watching her diet.  She has been on omeprazole for several years and was working until 4 months ago.  She also reports intermittent dysphagia to solids.  She denies nausea vomiting epigastric pain or melena.  She has been watching her diet.  She does not smoke cigarettes or drink alcohol. Last EGD was in March 2004 revealing small sliding hiatal hernia and irregular GE junction.  Past Medical History:  Diagnosis Date  . Breast cancer ([place])   . Cancer ([place])   . GERD (gastroesophageal reflux disease)     Past Surgical History:  Procedure Laterality Date  . ABDOMINAL HYSTERECTOMY  August 2010  . BREAST LUMPECTOMY Right   . BREAST SURGERY  2010   Breast cancer  . COLONOSCOPY N/A 11/07/2014   Procedure: COLONOSCOPY;  Surgeon: Rogene Houston, MD;  Location: [facility];  Service: Endoscopy;  Laterality: N/A;  1200    History reviewed. No pertinent family history. Social History:  reports that she has never smoked. She has never used smokeless tobacco. She reports that she does not drink alcohol or use drugs.  Allergies:  Allergies  Allergen Reactions  . Macrobid [Nitrofurantoin Macrocrystal]     Breathing problems. 02 level dropped    Medications Prior to Admission  Medication Sig Dispense Refill  . Calcium-Phosphorus-Vitamin D (CITRACAL CALCIUM GUMMIES PO) Take 2 tablets by mouth daily.     . Cholecalciferol (VITAMIN D3) 2000 units capsule Take by mouth daily.     . Cyanocobalamin (VITAMIN B 12 PO) Take 500 mg by mouth daily.    . famotidine (PEPCID) 20 MG tablet Take 1 tablet (20 mg total) by mouth daily before breakfast.    . Multiple Vitamin (MULTIVITAMIN WITH MINERALS) TABS tablet Take 1 tablet by mouth daily.    . Omega-3 Fatty Acids  (FISH OIL PO) Take 2 g by mouth daily.     Marland Kitchen omeprazole (PRILOSEC) 20 MG capsule Take 1 capsule (20 mg total) by mouth daily. 30 capsule 11  . OVER THE COUNTER MEDICATION 360 Cholesterol Capsule - patient states that she takes 1 daily.      No results found for this or any previous visit (from the past 48 hour(s)). No results found.  ROS  Blood pressure (!) 146/77, pulse (!) 56, temperature 98.1 F (36.7 C), temperature source Oral, resp. rate 12, SpO2 96 %. Physical Exam  Constitutional: She appears well-developed and well-nourished.  HENT:  Mouth/Throat: Oropharynx is clear and moist.  Eyes: Conjunctivae are normal. No scleral icterus.  Neck: No thyromegaly present.  Cardiovascular: Normal rate, regular rhythm and normal heart sounds.  No murmur heard. Respiratory: Effort normal and breath sounds normal.  GI: Soft. She exhibits no distension and no mass. There is no tenderness.  Musculoskeletal: She exhibits no edema.  Lymphadenopathy:    She has no cervical adenopathy.  Skin: Skin is warm and dry.     Assessment/Plan Chronic GERD refractory to current therapy. Esophageal dysphagia. EGD with ED.  Hildred Laser, MD 09/18/2018, 11:42 AM

## 2018-09-19 LAB — H. PYLORI ANTIBODY, IGG: H PYLORI IGG: 0.34 {index_val} (ref 0.00–0.79)

## 2018-09-21 ENCOUNTER — Encounter (HOSPITAL_COMMUNITY): Payer: Self-pay | Admitting: Internal Medicine

## 2018-10-25 ENCOUNTER — Telehealth (INDEPENDENT_AMBULATORY_CARE_PROVIDER_SITE_OTHER): Payer: Self-pay | Admitting: Internal Medicine

## 2018-10-25 NOTE — Telephone Encounter (Signed)
Patient was called and she was with a patient. Will return the call to her later.

## 2018-10-25 NOTE — Telephone Encounter (Signed)
Patient would like a call back regarding changing her medication - phone# [phone] or [phone]

## 2018-10-26 ENCOUNTER — Other Ambulatory Visit (INDEPENDENT_AMBULATORY_CARE_PROVIDER_SITE_OTHER): Payer: Self-pay | Admitting: *Deleted

## 2018-10-26 MED ORDER — OMEPRAZOLE 20 MG PO CPDR: 20.0000 mg | DELAYED_RELEASE_CAPSULE | Freq: Every day | ORAL | 5 refills | Status: DC

## 2018-10-26 NOTE — Telephone Encounter (Signed)
RXfor omeprazole has been sent to the pharmacy at [facility].

## 2018-10-26 NOTE — Telephone Encounter (Signed)
Patient states that the Protonix is giving her a headache. She would like to go back on the Omeprazole as before. She states she is doing better especially when she eats correctly.  The Omeprazole is to be sent electronically to the [facility] Department.  Will address with Dr.Rehman and if it is approved we will send it to pharmacy at [facility].

## 2018-11-29 ENCOUNTER — Ambulatory Visit (INDEPENDENT_AMBULATORY_CARE_PROVIDER_SITE_OTHER): Payer: Commercial Managed Care - PPO | Admitting: Urology

## 2018-11-29 DIAGNOSIS — R102 Pelvic and perineal pain: Secondary | ICD-10-CM

## 2018-11-29 DIAGNOSIS — N3020 Other chronic cystitis without hematuria: Secondary | ICD-10-CM | POA: Diagnosis not present

## 2018-12-27 DIAGNOSIS — B009 Herpesviral infection, unspecified: Secondary | ICD-10-CM

## 2018-12-27 HISTORY — DX: Herpesviral infection, unspecified: B00.9

## 2019-01-10 ENCOUNTER — Other Ambulatory Visit: Payer: Self-pay | Admitting: Oncology

## 2019-01-10 DIAGNOSIS — Z1231 Encounter for screening mammogram for malignant neoplasm of breast: Secondary | ICD-10-CM

## 2019-04-20 ENCOUNTER — Ambulatory Visit: Payer: Commercial Managed Care - PPO

## 2019-06-01 ENCOUNTER — Ambulatory Visit: Payer: Commercial Managed Care - PPO

## 2019-06-04 ENCOUNTER — Ambulatory Visit: Payer: Commercial Managed Care - PPO

## 2019-07-18 ENCOUNTER — Ambulatory Visit
Admission: RE | Admit: 2019-07-18 | Discharge: 2019-07-18 | Disposition: A | Discharge status: Home / Self Care | Payer: Commercial Managed Care - PPO | Source: Ambulatory Visit | Attending: Oncology | Admitting: Oncology

## 2019-07-18 ENCOUNTER — Other Ambulatory Visit: Payer: Self-pay

## 2019-07-18 DIAGNOSIS — Z1231 Encounter for screening mammogram for malignant neoplasm of breast: Secondary | ICD-10-CM

## 2019-10-16 ENCOUNTER — Ambulatory Visit (INDEPENDENT_AMBULATORY_CARE_PROVIDER_SITE_OTHER): Payer: Commercial Managed Care - PPO | Admitting: Obstetrics and Gynecology

## 2019-10-16 ENCOUNTER — Encounter: Payer: Self-pay | Admitting: Obstetrics and Gynecology

## 2019-10-16 ENCOUNTER — Other Ambulatory Visit: Payer: Self-pay

## 2019-10-16 ENCOUNTER — Encounter

## 2019-10-16 VITALS — BP 110/60 | HR 80 | Temp 97.3°F | Resp 12 | Ht 65.75 in | Wt 134.8 lb

## 2019-10-16 DIAGNOSIS — N760 Acute vaginitis: Secondary | ICD-10-CM | POA: Diagnosis not present

## 2019-10-16 DIAGNOSIS — N9089 Other specified noninflammatory disorders of vulva and perineum: Secondary | ICD-10-CM | POA: Diagnosis not present

## 2019-10-16 MED ORDER — FLUCONAZOLE 150 MG PO TABS: 150.0000 mg | ORAL_TABLET | Freq: Once | ORAL | 0 refills | Status: AC

## 2019-10-16 NOTE — Progress Notes (Signed)
GYNECOLOGY  VISIT   HPI: 63 y.o.   Widowed  Caucasian  female   G2P2002 with No LMP recorded. Patient has had a hysterectomy.   here as an old-new patient for white lesion on left vulva for an undetermined period of time per patient. She had a little irritation, so she looked with a mirror and saw some skin change.  Does not bleed.  Not itching.  No burning.   Hx HSV 1.  Not sexually active.   Does have a hx of a sore of her bottom which is not previously diagnosed.   Uses Dove soap.   She has recurrent UTI and taking Amoxicillin daily with Dr. Noah Delaine at [facility]. She had cystoscopy and her sling was not eroding.   Working for the health department.   She has had labs done and her glucose is normal.  GYNECOLOGIC HISTORY: No LMP recorded. Patient has had a hysterectomy. Contraception:  Hysterectomy Menopausal hormone therapy:  none Last mammogram:  07/18/19 BIRADS 1 negative/density b Last pap smear:   Years ago per patient         OB History    Gravida  2   Para  2   Term  2   Preterm  0   AB  0   Living  2     SAB  0   TAB  0   Ectopic  0   Multiple  0   Live Births  0              Patient Active Problem List   Diagnosis Date Noted  . Gastroesophageal reflux disease without esophagitis 09/05/2018  . Hypoxia 08/15/2016  . Drug-induced pneumonitis 08/15/2016  . GERD (gastroesophageal reflux disease) 06/04/2014    Past Medical History:  Diagnosis Date  . Breast cancer ([place])   . Cancer ([place])   . Chronic urinary tract infection   . GERD (gastroesophageal reflux disease)     Past Surgical History:  Procedure Laterality Date  . ABDOMINAL HYSTERECTOMY  August 2010  . BREAST LUMPECTOMY Right   . BREAST SURGERY  2010   Breast cancer  . COLONOSCOPY N/A 11/07/2014   Procedure: COLONOSCOPY;  Surgeon: Rogene Houston, MD;  [facility];  Service: Endoscopy;  Laterality: N/A;  1200  . ESOPHAGOGASTRODUODENOSCOPY N/A 09/18/2018    Procedure: ESOPHAGOGASTRODUODENOSCOPY (EGD);  Surgeon: Rogene Houston, MD;  [facility]: [facility];  Service: Endoscopy;  Laterality: N/A;  12:25    Current Outpatient Medications  Medication Sig Dispense Refill  . amoxicillin-clavulanate (AUGMENTIN) 500-125 MG tablet Take 1 tablet by mouth daily.    . Calcium-Phosphorus-Vitamin D (CITRACAL CALCIUM GUMMIES PO) Take 2 tablets by mouth daily.     . Cholecalciferol (VITAMIN D3) 2000 units capsule Take by mouth daily.     . Cyanocobalamin (VITAMIN B 12 PO) Take 500 mg by mouth daily.    . famotidine (PEPCID) 20 MG tablet Take 1 tablet (20 mg total) by mouth at bedtime as needed for heartburn or indigestion.    . Multiple Vitamin (MULTIVITAMIN WITH MINERALS) TABS tablet Take 1 tablet by mouth daily.    . Omega-3 Fatty Acids (FISH OIL PO) Take 2 g by mouth daily.     Marland Kitchen omeprazole (PRILOSEC) 20 MG capsule Take 1 capsule (20 mg total) by mouth daily. 60 capsule 5  . tamoxifen (NOLVADEX) 20 MG tablet Take by mouth.     No current facility-administered medications for this visit.  ALLERGIES: Pantoprazole and Macrobid [nitrofurantoin macrocrystal]  Family History  Problem Relation Age of Onset  . Breast cancer Mother 54  . Asthma Mother   . Lung cancer Father   . Bladder Cancer Brother   . Bladder Cancer Maternal Grandfather   . Lung cancer Brother     Social History   Socioeconomic History  . Marital status: Widowed    Spouse name: Not on file  . Number of children: Not on file  . Years of education: Not on file  . Highest education level: Not on file  Occupational History  . Not on file  Social Needs  . Financial resource strain: Not on file  . Food insecurity    Worry: Not on file    Inability: Not on file  . Transportation needs    Medical: Not on file    Non-medical: Not on file  Tobacco Use  . Smoking status: Never Smoker  . Smokeless tobacco: Never Used  Substance and Sexual Activity  . Alcohol use: No  .  Drug use: No  . Sexual activity: Not Currently    Birth control/protection: Surgical, Post-menopausal    Comment: Hysterectomy  Lifestyle  . Physical activity    Days per week: Not on file    Minutes per session: Not on file  . Stress: Not on file  Relationships  . Social Herbalist on phone: Not on file    Gets together: Not on file    Attends religious service: Not on file    Active member of club or organization: Not on file    Attends meetings of clubs or organizations: Not on file    Relationship status: Not on file  . Intimate partner violence    Fear of current or ex partner: Not on file    Emotionally abused: Not on file    Physically abused: Not on file    Forced sexual activity: Not on file  Other Topics Concern  . Not on file  Social History Narrative   Lives at home with pets in [place], [place], family nearby and active and fully functional in ADLs, IADLs. 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    Review of Systems  Constitutional: Negative.   HENT: Negative.   Eyes: Negative.   Respiratory: Negative.   Cardiovascular: Negative.   Gastrointestinal: Negative.   Endocrine: Negative.   Genitourinary:       White Lesion on left vulva  Musculoskeletal: Negative.   Skin: Negative.   Allergic/Immunologic: Negative.   Neurological: Negative.   Hematological: Negative.   Psychiatric/Behavioral: Negative.     PHYSICAL EXAMINATION:    BP 110/60   Pulse 80   Temp (!) 97.3 F (36.3 C) (Temporal)   Resp 12   Ht 5' 5.75" (1.67 m)   Wt 134 lb 12.8 oz (61.1 kg)   BMI 21.92 kg/m     General appearance: alert, cooperative and appears stated age Head: Normocephalic, without obvious abnormality, atraumatic Neck: no adenopathy, supple, symmetrical. Lungs: clear to auscultation bilaterally Heart: regular rate and rhythm Abdomen: soft, non-tender, no masses,  no organomegaly Extremities: extremities normal, atraumatic, no cyanosis or  edema Skin: Skin color, texture, turgor normal. No rashes or lesions Lymph nodes: Cervical, supraclavicular, and inguinal nodes normal. Neurologic: Grossly normal  Pelvic: External genitalia:  Excoriated vulvar lesion of the left labia majora and right labia majora.  White creamy discharge adherent to the vulva.  Urethra:  normal appearing urethra with no masses, tenderness or lesions              Bartholins and Skenes: normal                 Vagina: normal appearing vagina with normal color and discharge, no lesions              Cervix: absent                Bimanual Exam:  Uterus: absent              Adnexa: no mass, fullness, tenderness                  Chaperone was present for exam.  ASSESSMENT  Status post LAVH/BSO/anterior and posterior colporrhaphy/TVT midurethral sling, cystoscopy 2012.  Hx breast cancer dx in 2012.  Status post right lumpectomy and XRT. Finishing a course of Tamoxifen this month.  Vulvovaginitis.  I suspect this is candida. Vulvar lesion.  Looks like potential herpes.  Hx HSV 1.   PLAN  We discussed vulvovaginitis and potential HSV infection. Risk factors for yeast discussed.  Affirm taken.  HSV testing.  If testing is negative will do serology testing at follow up visit.  Diflucan 150 mg po x 1.  May repeat in 72 hours.  Fu 2 weeks.    An After Visit Summary was printed and given to the patient.  __30____ minutes face to face time of which over 50% was spent in counseling.

## 2019-10-16 NOTE — Patient Instructions (Signed)
Atrophic Vaginitis  Atrophic vaginitis is a condition in which the tissues that line the vagina become dry and thin. This condition is most common in women who have stopped having regular menstrual periods (are in menopause). This usually starts when a woman is 45-64 years old. That is the time when a woman's estrogen levels begin to drop (decrease). Estrogen is a female hormone. It helps to keep the tissues of the vagina moist. It stimulates the vagina to produce a clear fluid that lubricates the vagina for sexual intercourse. This fluid also protects the vagina from infection. Lack of estrogen can cause the lining of the vagina to get thinner and dryer. The vagina may also shrink in size. It may become less elastic. Atrophic vaginitis tends to get worse over time as a woman's estrogen level drops. What are the causes? This condition is caused by the normal drop in estrogen that happens around the time of menopause. What increases the risk? Certain conditions or situations may lower a woman's estrogen level, leading to a higher risk for atrophic vaginitis. You are more likely to develop this condition if:  You are taking medicines that block estrogen.  You have had your ovaries removed.  You are being treated for cancer with X-ray (radiation) or medicines (chemotherapy).  You have given birth or are breastfeeding.  You are older than age 50.  You smoke. What are the signs or symptoms? Symptoms of this condition include:  Pain, soreness, or bleeding during sexual intercourse (dyspareunia).  Vaginal burning, irritation, or itching.  Pain or bleeding when a speculum is used in a vaginal exam (pelvic exam).  Having burning pain when passing urine.  Vaginal discharge that is brown or yellow. In some cases, there are no symptoms. How is this diagnosed? This condition is diagnosed by taking a medical history and doing a physical exam. This will include a pelvic exam that checks the  vaginal tissues. Though rare, you may also have other tests, including:  A urine test.  A test that checks the acid balance in your vagina (acid balance test). How is this treated? Treatment for this condition depends on how severe your symptoms are. Treatment may include:  Using an over-the-counter vaginal lubricant before sex.  Using a long-acting vaginal moisturizer.  Using low-dose vaginal estrogen for moderate to severe symptoms that do not respond to other treatments. Options include creams, tablets, and inserts (vaginal rings). Before you use a vaginal estrogen, tell your health care provider if you have a history of: ? Breast cancer. ? Endometrial cancer. ? Blood clots. If you are not sexually active and your symptoms are very mild, you may not need treatment. Follow these instructions at home: Medicines  Take over-the-counter and prescription medicines only as told by your health care provider. Do not use herbal or alternative medicines unless your health care provider says that you can.  Use over-the-counter creams, lubricants, or moisturizers for dryness only as directed by your health care provider. General instructions  If your atrophic vaginitis is caused by menopause, discuss all of your menopause symptoms and treatment options with your health care provider.  Do not douche.  Do not use products that can make your vagina dry. These include: ? Scented feminine sprays. ? Scented tampons. ? Scented soaps.  Vaginal intercourse can help to improve blood flow and elasticity of vaginal tissue. If it hurts to have sex, try using a lubricant or moisturizer just before having intercourse. Contact a health care provider if:    Your discharge looks different than normal.  Your vagina has an unusual smell.  You have new symptoms.  Your symptoms do not improve with treatment.  Your symptoms get worse. Summary  Atrophic vaginitis is a condition in which the tissues that  line the vagina become dry and thin. It is most common in women who have stopped having regular menstrual periods (are in menopause).  Treatment options include using vaginal lubricants and low-dose vaginal estrogen.  Contact a health care provider if your vagina has an unusual smell, or if your symptoms get worse or do not improve after treatment. This information is not intended to replace advice given to you by your health care provider. Make sure you discuss any questions you have with your health care provider. Document Released: 04/29/2015 Document Revised: 11/25/2017 Document Reviewed: 09/08/2017 Elsevier Patient Education  2020 Elsevier Inc.  

## 2019-10-17 ENCOUNTER — Telehealth: Payer: Self-pay | Admitting: *Deleted

## 2019-10-17 LAB — VAGINITIS/VAGINOSIS, DNA PROBE
Candida Species: NEGATIVE
Gardnerella vaginalis: NEGATIVE
Trichomonas vaginosis: NEGATIVE

## 2019-10-17 NOTE — Telephone Encounter (Signed)
-----   Message from Nunzio Cobbs, MD sent at 10/17/2019 12:49 PM EDT ----- Please report results of Affirm to patient which is negative for internal infection.  I recommend she take the Diflucan to treat external yeast infection.  She is taking daily antibiotic for recurrent urinary tract infection, and this is likely what is contributing to the discharge and external yeast.  The Diflucan should clear this up.  I recommend she do a follow up visit with me in 2 weeks.   Her other testing is not back yet.

## 2019-10-17 NOTE — Telephone Encounter (Signed)
Patient returned call. Results reviewed with patient and she verbalized understanding. Patient states she will complete diflucan. Patient is scheduled for follow up on 11-09-2019 with Dr. Quincy Simmonds.   Routing to provider and will close encounter.

## 2019-10-17 NOTE — Telephone Encounter (Signed)
Message left to return call to Triage Nurse at [phone].    

## 2019-10-18 LAB — HSV NAA
HSV 1 NAA: NEGATIVE
HSV 2 NAA: POSITIVE — AB

## 2019-10-19 ENCOUNTER — Encounter: Payer: Self-pay | Admitting: Obstetrics and Gynecology

## 2019-10-23 ENCOUNTER — Telehealth: Payer: Self-pay | Admitting: *Deleted

## 2019-10-23 NOTE — Telephone Encounter (Signed)
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  I discussed this possibility with the patient at the time of her visit.  She had lesions that were healing over that were suspicious for herpes.   She has an appointment with me on 11/09/19 for a recheck.  If she would like to come in next week, I can see her sooner.

## 2019-10-23 NOTE — Telephone Encounter (Signed)
Notes recorded by Burnice Logan, RN on 10/23/2019 at 8:53 AM EDT  Left message to call Sharee Pimple, RN at [facility].

## 2019-10-24 NOTE — Telephone Encounter (Signed)
Message left to return call to Triage Nurse at [phone].    

## 2019-10-24 NOTE — Telephone Encounter (Signed)
Patient returned call. Patient advised of results as seen below from Dr. Quincy Simmonds and she verbalized understanding. States she doesn't have outbreaks often and the area is not currently bothering her, so she is okay to keep appointment as scheduled for 11-09-2019 for follow up.  Routing to provider and will close encounter.

## 2019-10-24 NOTE — Telephone Encounter (Signed)
Patient returning call to Jill. °

## 2019-10-25 ENCOUNTER — Telehealth: Payer: Self-pay | Admitting: Obstetrics and Gynecology

## 2019-10-25 NOTE — Telephone Encounter (Signed)
Left message on voicemail to call and reschedule cancelled appointment. °

## 2019-10-26 ENCOUNTER — Telehealth: Payer: Self-pay | Admitting: Obstetrics and Gynecology

## 2019-10-26 NOTE — Telephone Encounter (Signed)
Left message on voicemail to call and reschedule cancelled appointment. °

## 2019-10-29 NOTE — Telephone Encounter (Signed)
Left message on voicemail to reschedule appointment that was cancelled from 11/09/2019.

## 2019-11-01 ENCOUNTER — Encounter: Payer: Self-pay | Admitting: Obstetrics and Gynecology

## 2019-11-01 NOTE — Telephone Encounter (Signed)
Letter mailed to patient regarding cancelled appointment.

## 2019-11-09 ENCOUNTER — Ambulatory Visit: Payer: Commercial Managed Care - PPO | Admitting: Obstetrics and Gynecology

## 2019-12-05 ENCOUNTER — Ambulatory Visit (INDEPENDENT_AMBULATORY_CARE_PROVIDER_SITE_OTHER): Payer: Commercial Managed Care - PPO | Admitting: Urology

## 2019-12-05 DIAGNOSIS — R102 Pelvic and perineal pain: Secondary | ICD-10-CM

## 2019-12-05 DIAGNOSIS — N3020 Other chronic cystitis without hematuria: Secondary | ICD-10-CM

## 2019-12-06 NOTE — Progress Notes (Deleted)
GYNECOLOGY  VISIT   HPI: 63 y.o.   Widowed  Caucasian  female   G2P2002 with No LMP recorded. Patient has had a hysterectomy.   here for follow up.   GYNECOLOGIC HISTORY: No LMP recorded. Patient has had a hysterectomy. Contraception:  Hysterectomy Menopausal hormone therapy:  none Last mammogram: 07/18/19 BIRADS 1 negative/density b Last pap smear: Years ago per patient        OB History    Gravida  2   Para  2   Term  2   Preterm  0   AB  0   Living  2     SAB  0   TAB  0   Ectopic  0   Multiple  0   Live Births  0              Patient Active Problem List   Diagnosis Date Noted  . Gastroesophageal reflux disease without esophagitis 09/05/2018  . Hypoxia 08/15/2016  . Drug-induced pneumonitis 08/15/2016  . GERD (gastroesophageal reflux disease) 06/04/2014    Past Medical History:  Diagnosis Date  . Breast cancer ([place])   . Cancer ([place])   . Chronic urinary tract infection   . GERD (gastroesophageal reflux disease)   . HSV-1 infection   . HSV-2 infection 2020    Past Surgical History:  Procedure Laterality Date  . ABDOMINAL HYSTERECTOMY  August 2010  . BREAST LUMPECTOMY Right   . BREAST SURGERY  2010   Breast cancer  . COLONOSCOPY N/A 11/07/2014   Procedure: COLONOSCOPY;  Surgeon: Rogene Houston, MD;  [facility];  Service: Endoscopy;  Laterality: N/A;  1200  . ESOPHAGOGASTRODUODENOSCOPY N/A 09/18/2018   Procedure: ESOPHAGOGASTRODUODENOSCOPY (EGD);  Surgeon: Rogene Houston, MD;  [facility];  Service: Endoscopy;  Laterality: N/A;  12:25    Current Outpatient Medications  Medication Sig Dispense Refill  . amoxicillin-clavulanate (AUGMENTIN) 500-125 MG tablet Take 1 tablet by mouth daily.    . Calcium-Phosphorus-Vitamin D (CITRACAL CALCIUM GUMMIES PO) Take 2 tablets by mouth daily.     . Cholecalciferol (VITAMIN D3) 2000 units capsule Take by mouth daily.     . Cyanocobalamin (VITAMIN B 12 PO) Take 500 mg by mouth  daily.    . famotidine (PEPCID) 20 MG tablet Take 1 tablet (20 mg total) by mouth at bedtime as needed for heartburn or indigestion.    . Multiple Vitamin (MULTIVITAMIN WITH MINERALS) TABS tablet Take 1 tablet by mouth daily.    . Omega-3 Fatty Acids (FISH OIL PO) Take 2 g by mouth daily.     Marland Kitchen omeprazole (PRILOSEC) 20 MG capsule Take 1 capsule (20 mg total) by mouth daily. 60 capsule 5  . tamoxifen (NOLVADEX) 20 MG tablet Take by mouth.     No current facility-administered medications for this visit.     ALLERGIES: Pantoprazole and Macrobid [nitrofurantoin macrocrystal]  Family History  Problem Relation Age of Onset  . Breast cancer Mother 2  . Asthma Mother   . Lung cancer Father   . Bladder Cancer Brother   . Bladder Cancer Maternal Grandfather   . Lung cancer Brother     Social History   Socioeconomic History  . Marital status: Widowed    Spouse name: Not on file  . Number of children: Not on file  . Years of education: Not on file  . Highest education level: Not on file  Occupational History  . Not on file  Tobacco Use  .  Smoking status: Never Smoker  . Smokeless tobacco: Never Used  Substance and Sexual Activity  . Alcohol use: No  . Drug use: No  . Sexual activity: Not Currently    Birth control/protection: Surgical, Post-menopausal    Comment: Hysterectomy  Other Topics Concern  . Not on file  Social History Narrative   Lives at home with pets in [place], [place], family nearby and active and fully functional in ADLs, IADLs. 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   Social Determinants of Health   Financial Resource Strain:   . Difficulty of Paying Living Expenses: Not on file  Food Insecurity:   . Worried About Charity fundraiser in the Last Year: Not on file  . Ran Out of Food in the Last Year: Not on file  Transportation Needs:   . Lack of Transportation (Medical): Not on file  . Lack of Transportation (Non-Medical): Not on file  Physical  Activity:   . Days of Exercise per Week: Not on file  . Minutes of Exercise per Session: Not on file  Stress:   . Feeling of Stress : Not on file  Social Connections:   . Frequency of Communication with Friends and Family: Not on file  . Frequency of Social Gatherings with Friends and Family: Not on file  . Attends Religious Services: Not on file  . Active Member of Clubs or Organizations: Not on file  . Attends Archivist Meetings: Not on file  . Marital Status: Not on file  Intimate Partner Violence:   . Fear of Current or Ex-Partner: Not on file  . Emotionally Abused: Not on file  . Physically Abused: Not on file  . Sexually Abused: Not on file    Review of Systems  PHYSICAL EXAMINATION:    There were no vitals taken for this visit.    General appearance: alert, cooperative and appears stated age Head: Normocephalic, without obvious abnormality, atraumatic Neck: no adenopathy, supple, symmetrical, trachea midline and thyroid normal to inspection and palpation Lungs: clear to auscultation bilaterally Breasts: normal appearance, no masses or tenderness, No nipple retraction or dimpling, No nipple discharge or bleeding, No axillary or supraclavicular adenopathy Heart: regular rate and rhythm Abdomen: soft, non-tender, no masses,  no organomegaly Extremities: extremities normal, atraumatic, no cyanosis or edema Skin: Skin color, texture, turgor normal. No rashes or lesions Lymph nodes: Cervical, supraclavicular, and axillary nodes normal. No abnormal inguinal nodes palpated Neurologic: Grossly normal  Pelvic: External genitalia:  no lesions              Urethra:  normal appearing urethra with no masses, tenderness or lesions              Bartholins and Skenes: normal                 Vagina: normal appearing vagina with normal color and discharge, no lesions              Cervix: no lesions                Bimanual Exam:  Uterus:  normal size, contour, position,  consistency, mobility, non-tender              Adnexa: no mass, fullness, tenderness              Rectal exam: {yes no:[id number]}.  Confirms.              Anus:  normal sphincter tone, no lesions  Chaperone  was present for exam.  ASSESSMENT     PLAN     An After Visit Summary was printed and given to the patient.  ______ minutes face to face time of which over 50% was spent in counseling.

## 2019-12-10 ENCOUNTER — Ambulatory Visit: Payer: Commercial Managed Care - PPO | Admitting: Obstetrics and Gynecology

## 2019-12-11 ENCOUNTER — Telehealth: Payer: Self-pay | Admitting: Obstetrics and Gynecology

## 2019-12-11 NOTE — Telephone Encounter (Signed)
Left message on voicemail to call and reschedule cancelled appointment. °

## 2020-01-14 ENCOUNTER — Ambulatory Visit: Payer: Commercial Managed Care - PPO | Admitting: Obstetrics and Gynecology

## 2020-01-15 ENCOUNTER — Encounter: Payer: Self-pay | Admitting: Obstetrics and Gynecology

## 2020-01-15 ENCOUNTER — Ambulatory Visit (INDEPENDENT_AMBULATORY_CARE_PROVIDER_SITE_OTHER): Payer: Commercial Managed Care - PPO | Admitting: Obstetrics and Gynecology

## 2020-01-15 ENCOUNTER — Other Ambulatory Visit: Payer: Self-pay

## 2020-01-15 VITALS — BP 140/90 | HR 70 | Temp 96.7°F | Ht 65.75 in | Wt 138.2 lb

## 2020-01-15 DIAGNOSIS — Z719 Counseling, unspecified: Secondary | ICD-10-CM | POA: Diagnosis not present

## 2020-01-15 NOTE — Progress Notes (Signed)
GYNECOLOGY  VISIT   HPI: 64 y.o.   Widowed  Caucasian  female   G2P2002 with No LMP recorded. Patient has had a hysterectomy.   here for follow up.   Patient has area at the top of clitoris that is very white--wants to make sure Dr.saw at last visit.  States she is not really having any pain in the area.  No itching or bleeding.   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 She states she does not have outbreaks that are painful or bothersome.   She also has HSV 1.   She is on Augmentin for UTI prophylaxis.   GYNECOLOGIC HISTORY: No LMP recorded. Patient has had a hysterectomy. Contraception: Hyst Menopausal hormone therapy:  none Last mammogram:   07/18/19 BIRADS 1 negative/density b Last pap smear: Years ago-normal per patient        OB History    Gravida  2   Para  2   Term  2   Preterm  0   AB  0   Living  2     SAB  0   TAB  0   Ectopic  0   Multiple  0   Live Births  0              Patient Active Problem List   Diagnosis Date Noted  . Gastroesophageal reflux disease without esophagitis 09/05/2018  . Hypoxia 08/15/2016  . Drug-induced pneumonitis 08/15/2016  . GERD (gastroesophageal reflux disease) 06/04/2014    Past Medical History:  Diagnosis Date  . Breast cancer ([place])   . Cancer ([place])   . Chronic urinary tract infection   . GERD (gastroesophageal reflux disease)   . HSV-1 infection   . HSV-2 infection 2020    Past Surgical History:  Procedure Laterality Date  . ABDOMINAL HYSTERECTOMY  August 2010  . BREAST LUMPECTOMY Right   . BREAST SURGERY  2010   Breast cancer  . COLONOSCOPY N/A 11/07/2014   Procedure: COLONOSCOPY;  Surgeon: Rogene Houston, MD;  [facility];  Service: Endoscopy;  Laterality: N/A;  1200  . ESOPHAGOGASTRODUODENOSCOPY N/A 09/18/2018   Procedure: ESOPHAGOGASTRODUODENOSCOPY (EGD);  Surgeon: Rogene Houston, MD;  Location: [facility];  Service: Endoscopy;  Laterality: N/A;  12:25    Current  Outpatient Medications  Medication Sig Dispense Refill  . amoxicillin-clavulanate (AUGMENTIN) 500-125 MG tablet Take 1 tablet by mouth daily.    . Calcium-Phosphorus-Vitamin D (CITRACAL CALCIUM GUMMIES PO) Take 2 tablets by mouth daily.     . Cholecalciferol (VITAMIN D3) 2000 units capsule Take by mouth daily.     . Cyanocobalamin (VITAMIN B 12 PO) Take 500 mg by mouth daily.    . famotidine (PEPCID) 20 MG tablet Take 1 tablet (20 mg total) by mouth at bedtime as needed for heartburn or indigestion.    . Multiple Vitamin (MULTIVITAMIN WITH MINERALS) TABS tablet Take 1 tablet by mouth daily.    . Omega-3 Fatty Acids (FISH OIL PO) Take 2 g by mouth daily.     Marland Kitchen omeprazole (PRILOSEC) 20 MG capsule Take 1 capsule (20 mg total) by mouth daily. 60 capsule 5  . tamoxifen (NOLVADEX) 20 MG tablet Take by mouth.     No current facility-administered medications for this visit.     ALLERGIES: Pantoprazole and Macrobid [nitrofurantoin macrocrystal]  Family History  Problem Relation Age of Onset  . Breast cancer Mother 80  . Asthma Mother   . Lung  cancer Father   . Bladder Cancer Brother   . Bladder Cancer Maternal Grandfather   . Lung cancer Brother     Social History   Socioeconomic History  . Marital status: Widowed    Spouse name: Not on file  . Number of children: Not on file  . Years of education: Not on file  . Highest education level: Not on file  Occupational History  . Not on file  Tobacco Use  . Smoking status: Never Smoker  . Smokeless tobacco: Never Used  Substance and Sexual Activity  . Alcohol use: No  . Drug use: No  . Sexual activity: Not Currently    Birth control/protection: Surgical, Post-menopausal    Comment: Hysterectomy  Other Topics Concern  . Not on file  Social History Narrative   Lives at home with pets in [place], [place], family nearby and active and fully functional in ADLs, IADLs. 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   Social  Determinants of Health   Financial Resource Strain:   . Difficulty of Paying Living Expenses: Not on file  Food Insecurity:   . Worried About Charity fundraiser in the Last Year: Not on file  . Ran Out of Food in the Last Year: Not on file  Transportation Needs:   . Lack of Transportation (Medical): Not on file  . Lack of Transportation (Non-Medical): Not on file  Physical Activity:   . Days of Exercise per Week: Not on file  . Minutes of Exercise per Session: Not on file  Stress:   . Feeling of Stress : Not on file  Social Connections:   . Frequency of Communication with Friends and Family: Not on file  . Frequency of Social Gatherings with Friends and Family: Not on file  . Attends Religious Services: Not on file  . Active Member of Clubs or Organizations: Not on file  . Attends Archivist Meetings: Not on file  . Marital Status: Not on file  Intimate Partner Violence:   . Fear of Current or Ex-Partner: Not on file  . Emotionally Abused: Not on file  . Physically Abused: Not on file  . Sexually Abused: Not on file    Review of Systems  All other systems reviewed and are negative.   PHYSICAL EXAMINATION:    BP 140/90   Pulse 70   Temp (!) 96.7 F (35.9 C) (Temporal)   Ht 5' 5.75" (1.67 m)   Wt 138 lb 3.2 oz (62.7 kg)   BMI 22.48 kg/m     General appearance: alert, cooperative and appears stated age  Pelvic: External genitalia:  no lesions.  The clitoral hood has white mucous and flakes of (toilet?)  paper, which I removed.               Urethra:  normal appearing urethra with no masses, tenderness or lesions   Chaperone was present for exam:  [place] education visit.  Removal of foreign body.  Taking Augmentin for UTI prophylaxis.   PLAN  We discussed mucus under the clitoral hood, which is normal.  I also educated her about her toilet tissue leaving residual pap in this area.  Fu prn.    An After Visit Summary was  printed and given to the patient.  __15____ minutes face to face time of which over 50% was spent in counseling.

## 2020-02-04 ENCOUNTER — Other Ambulatory Visit: Payer: Self-pay | Admitting: Oncology

## 2020-02-04 DIAGNOSIS — Z1231 Encounter for screening mammogram for malignant neoplasm of breast: Secondary | ICD-10-CM

## 2020-07-18 ENCOUNTER — Other Ambulatory Visit: Payer: Self-pay

## 2020-07-18 ENCOUNTER — Ambulatory Visit
Admission: RE | Admit: 2020-07-18 | Discharge: 2020-07-18 | Disposition: A | Discharge status: Home / Self Care | Payer: Commercial Managed Care - PPO | Source: Ambulatory Visit | Attending: Oncology | Admitting: Oncology

## 2020-07-18 DIAGNOSIS — Z1231 Encounter for screening mammogram for malignant neoplasm of breast: Secondary | ICD-10-CM

## 2020-08-25 ENCOUNTER — Telehealth (INDEPENDENT_AMBULATORY_CARE_PROVIDER_SITE_OTHER): Payer: Self-pay | Admitting: Internal Medicine

## 2020-08-25 NOTE — Telephone Encounter (Signed)
Cathy a Software engineer with the [facility] Dept left message regarding a refill on omeprazole for patient - please advise - ph# [phone]

## 2020-08-26 NOTE — Telephone Encounter (Signed)
Talked with the Pharmacist, Tye Maryland. Patient is out of the PPI and is requesting refills. She also has appointment 09/03/2020 with Laurine Blazer , PA-C.  Refills were given but it was stressed that the patient wail need to keep her appointment on 09/03/2020, if missed this could delay any further refills for this patient. Pharmacist states that she will definitely make sure the patient knows.

## 2020-09-03 ENCOUNTER — Ambulatory Visit (INDEPENDENT_AMBULATORY_CARE_PROVIDER_SITE_OTHER): Payer: Commercial Managed Care - PPO | Admitting: Gastroenterology

## 2020-11-18 ENCOUNTER — Other Ambulatory Visit: Payer: Self-pay

## 2020-11-18 DIAGNOSIS — N3020 Other chronic cystitis without hematuria: Secondary | ICD-10-CM

## 2020-11-18 MED ORDER — AMOXICILLIN-POT CLAVULANATE 500-125 MG PO TABS: 1.0000 | ORAL_TABLET | Freq: Every day | ORAL | 0 refills | Status: AC

## 2020-11-24 ENCOUNTER — Ambulatory Visit (INDEPENDENT_AMBULATORY_CARE_PROVIDER_SITE_OTHER): Payer: Commercial Managed Care - PPO | Admitting: Gastroenterology

## 2020-12-01 ENCOUNTER — Ambulatory Visit (INDEPENDENT_AMBULATORY_CARE_PROVIDER_SITE_OTHER): Payer: Commercial Managed Care - PPO | Admitting: Gastroenterology

## 2020-12-03 ENCOUNTER — Ambulatory Visit (INDEPENDENT_AMBULATORY_CARE_PROVIDER_SITE_OTHER): Payer: Commercial Managed Care - PPO | Admitting: Urology

## 2020-12-03 ENCOUNTER — Encounter: Payer: Self-pay | Admitting: Urology

## 2020-12-03 ENCOUNTER — Ambulatory Visit: Payer: Commercial Managed Care - PPO | Admitting: Urology

## 2020-12-03 ENCOUNTER — Other Ambulatory Visit: Payer: Self-pay

## 2020-12-03 VITALS — BP 116/81 | HR 79 | Temp 98.4°F

## 2020-12-03 DIAGNOSIS — N3021 Other chronic cystitis with hematuria: Secondary | ICD-10-CM

## 2020-12-03 DIAGNOSIS — R102 Pelvic and perineal pain: Secondary | ICD-10-CM

## 2020-12-03 MED ORDER — AMOXICILLIN-POT CLAVULANATE 500-125 MG PO TABS: 1.0000 | ORAL_TABLET | Freq: Every day | ORAL | 3 refills | Status: DC

## 2020-12-03 NOTE — Patient Instructions (Signed)

## 2020-12-03 NOTE — Progress Notes (Signed)

## 2020-12-03 NOTE — Progress Notes (Signed)
12/03/2020 1:51 PM   [name] 09/29/1956 [id number]  Referring provider: Health, [facility] [address] [place],  [place] [address]  followup recurrent UTI  HPI: [name] is a 64yo here for followup for recurrent UTI. No UTIs since last visit. NO pelvic pain. She is taking Augmentin 500 daily. No LUTS.No other complaints today. No dysuira or hematuria  Her records from [facility] are as follows: I have chronic cystitis.  HPI: [name] is a 64 year-old female established patient who is here for chronic cystitis.  She does have a burning sensation when she urinates. She does have to strain or bear down to start her urinary stream. She is having problems getting her urine stream started. She is currently having trouble urinating. She is not having problems with emptying her bladder well.   She is having problems with urinary control or incontinence. She is urinating more frequently now than usual.   She does not have pelvic or rectal pain related to voiding.   05/04/2017: We have tried macrodantin which worked but she stopped due to difficulty breathing. We then tried trimethoprim, hiprex and she had several UTIs. She was recently treated for an E coli UTI and since started trimethoprim again.   07/27/2017: 4 days ago she developed urgency, frequency and dysuria. She went to an urgent care and was given keflex 500mg  which has not relieved her symptoms. UA today is concerning for infection.   08/09/2017: Urine culture from 8/1 grew pseudomonas sensitive to cipro. cipro did not relieve her symptoms. UA today is concerning for infection. She has been treated with keflex, cipro and bactrim.   08/17/2017: Urine culture grew E coli resistent to cipro, bactrim. She has difficulty breathing with macrodantin. Renal US was normal   11/29/2018: She got 5 UTIs in the past year. She uses augmentin prn for her UTIs.   12/05/2019: No UTIs in the past year. She is on augmentin  prophylaxis     CC: I have pelvic pain.  HPI: The problem is on both sides. She first noticed the pain approximately 07/23/2017. She has had this kind of pain before. The pain is not related to her menstrual period.   Her pain is sharp. The intensity of her pain is rated as a 6. Her pain is not intermittent. Urinating and full bladder makes the pain worse. None< makes the pain better.   She does not have pain with bowel movements. She does not have trouble with constipation.   07/27/2017: 4 days ago she developed pelvic pain with associated dysuria, urgency and frequency. UA is concerning for infection   08/09/2017: She continues to have pelvic pain and UA is once again concerning for infection.   08/17/2017: Pelvic pain resolved after fosfomycin therapy.   11/29/2018: She has intermittent pelvic pain which is improved with improving her constipation. NO current pelvic pain   12/05/2019: pelvic pain resolved      PMH: Past Medical History:  Diagnosis Date  . Breast cancer ([place]) 2010   Right Breast Cancer  . Cancer ([place])   . Chronic urinary tract infection   . GERD (gastroesophageal reflux disease)   . HSV-1 infection   . HSV-2 infection 2020    Surgical History: Past Surgical History:  Procedure Laterality Date  . ABDOMINAL HYSTERECTOMY  August 2010  . BREAST LUMPECTOMY Right 2010  . BREAST SURGERY  2010   Breast cancer  . COLONOSCOPY N/A 11/07/2014   Procedure: COLONOSCOPY;  Surgeon: Bernadene Person  Gloriann Loan, MD;  [facility];  Service: Endoscopy;  Laterality: N/A;  1200  . ESOPHAGOGASTRODUODENOSCOPY N/A 09/18/2018   Procedure: ESOPHAGOGASTRODUODENOSCOPY (EGD);  Surgeon: Rogene Houston, MD;  Location[facility];  Service: Endoscopy;  Laterality: N/A;  12:25    Home Medications:  Allergies as of 12/03/2020      Reactions   Pantoprazole    Patient states that it gave her headaches.   Macrobid [nitrofurantoin Macrocrystal]    Breathing problems. 02 level dropped       Medication List       Accurate as of December 03, 2020  1:51 PM. If you have any questions, ask your nurse or doctor.        STOP taking these medications   famotidine 20 MG tablet Commonly known as: Pepcid Stopped by: Nicolette Bang, MD   tamoxifen 20 MG tablet Commonly known as: NOLVADEX Stopped by: Nicolette Bang, MD     TAKE these medications   amoxicillin-clavulanate 500-125 MG tablet Commonly known as: AUGMENTIN Take 1 tablet by mouth daily.   CITRACAL CALCIUM GUMMIES PO Take 2 tablets by mouth daily.   FISH OIL PO Take 2 g by mouth daily.   multivitamin with minerals Tabs tablet Take 1 tablet by mouth daily.   omeprazole 20 MG capsule Commonly known as: PRILOSEC Take 1 capsule (20 mg total) by mouth daily.   VITAMIN B 12 PO Take 500 mg by mouth daily.   Vitamin D3 50 MCG (2000 UT) capsule Take by mouth daily.       Allergies:  Allergies  Allergen Reactions  . Pantoprazole     Patient states that it gave her headaches.  Santiago Bur [Nitrofurantoin Macrocrystal]     Breathing problems. 02 level dropped    Family History: Family History  Problem Relation Age of Onset  . Breast cancer Mother 67  . Asthma Mother   . Lung cancer Father   . Bladder Cancer Brother   . Bladder Cancer Maternal Grandfather   . Lung cancer Brother     Social History:  reports that she has never smoked. She has never used smokeless tobacco. She reports that she does not drink alcohol and does not use drugs.  ROS: All other review of systems were reviewed and are negative except what is noted above in HPI  Physical Exam: BP 116/81   Pulse 79   Temp 98.4 F (36.9 C)   Constitutional:  Alert and oriented, No acute distress. HEENT: [place] AT, moist mucus membranes.  Trachea midline, no masses. Cardiovascular: No clubbing, cyanosis, or edema. Respiratory: Normal respiratory effort, no increased work of breathing. GI: Abdomen is soft, nontender, nondistended, no  abdominal masses GU: No CVA tenderness.  Lymph: No cervical or inguinal lymphadenopathy. Skin: No rashes, bruises or suspicious lesions. Neurologic: Grossly intact, no focal deficits, moving all 4 extremities. Psychiatric: Normal mood and affect.  Laboratory Data: Lab Results  Component Value Date   WBC 7.8 05/13/2018   HGB 13.1 05/13/2018   HCT 40.0 05/13/2018   MCV 92.8 05/13/2018   PLT 196 05/13/2018    Lab Results  Component Value Date   CREATININE 0.71 05/13/2018    No results found for: PSA  No results found for: TESTOSTERONE  No results found for: HGBA1C  Urinalysis No results found for: COLORURINE, APPEARANCEUR, LABSPEC, PHURINE, GLUCOSEU, HGBUR, BILIRUBINUR, KETONESUR, PROTEINUR, UROBILINOGEN, NITRITE, LEUKOCYTESUR  No results found for: LABMICR, WBCUA, RBCUA, LABEPIT, MUCUS, BACTERIA  Pertinent Imaging:  No  results found for this or any previous visit.  No results found for this or any previous visit.  No results found for this or any previous visit.  No results found for this or any previous visit.  Results for orders placed during the hospital encounter of 08/17/17  US Renal  Narrative CLINICAL DATA:  Chronic cystitis  EXAM: RENAL / URINARY TRACT ULTRASOUND COMPLETE  COMPARISON:  None.  FINDINGS: Right Kidney:  Length: 10.7 cm. Echogenicity and renal cortical thickness are within normal limits. No mass, perinephric fluid, or hydronephrosis visualized. No sonographically demonstrable calculus or ureterectasis.  Left Kidney:  Length: 13.0 cm. Echogenicity and renal cortical thickness are within normal limits. No mass, perinephric fluid, or hydronephrosis visualized. No sonographically demonstrable calculus or ureterectasis.  Bladder:  Appears normal for degree of bladder distention. Prevoid measured volume is 351 mL. Postvoid volume measured at 71 mL.  IMPRESSION: 1. Left kidney larger than right kidney. Significance of  this finding uncertain. This finding potentially could indicate renal artery stenosis on the right. In this regard, question whether patient is hypertensive.  2.  Kidneys otherwise appear normal bilaterally.  3.  Relatively mild postvoid residual in urinary bladder.   Electronically Signed By: Lowella Grip III M.D. On: 08/17/2017 10:41  No results found for this or any previous visit.  No results found for this or any previous visit.  No results found for this or any previous visit.   Assessment & Plan:    1. Chronic cystitis with hematuria -Continue Augmentin daily  2. Pelvic pain in female -resolved. RCT 1 year   No follow-ups on file.  Nicolette Bang, MD  [facility] [place]

## 2021-02-11 ENCOUNTER — Other Ambulatory Visit: Payer: Self-pay | Admitting: Oncology

## 2021-02-11 DIAGNOSIS — Z1231 Encounter for screening mammogram for malignant neoplasm of breast: Secondary | ICD-10-CM

## 2021-03-10 ENCOUNTER — Other Ambulatory Visit: Payer: Self-pay

## 2021-03-10 ENCOUNTER — Telehealth: Payer: Self-pay

## 2021-03-10 ENCOUNTER — Other Ambulatory Visit: Payer: Commercial Managed Care - PPO

## 2021-03-10 DIAGNOSIS — N3021 Other chronic cystitis with hematuria: Secondary | ICD-10-CM

## 2021-03-10 LAB — URINALYSIS, ROUTINE W REFLEX MICROSCOPIC
Bilirubin, UA: NEGATIVE
Glucose, UA: NEGATIVE
Ketones, UA: NEGATIVE
Nitrite, UA: NEGATIVE
Protein,UA: NEGATIVE
RBC, UA: NEGATIVE
Specific Gravity, UA: 1.02 (ref 1.005–1.030)
Urobilinogen, Ur: 0.2 mg/dL (ref 0.2–1.0)
pH, UA: 7.5 (ref 5.0–7.5)

## 2021-03-10 LAB — MICROSCOPIC EXAMINATION
Epithelial Cells (non renal): >10 /hpf — AB (ref 0–10)
RBC: NONE SEEN /hpf (ref 0–2)
Renal Epithel, UA: NONE SEEN /hpf
WBC, UA: >30 /hpf — AB (ref 0–5)

## 2021-03-10 MED ORDER — FLUCONAZOLE 150 MG PO TABS: 150.0000 mg | ORAL_TABLET | Freq: Every day | ORAL | 0 refills | Status: DC

## 2021-03-10 MED ORDER — CIPROFLOXACIN HCL 250 MG PO TABS: 250.0000 mg | ORAL_TABLET | Freq: Two times a day (BID) | ORAL | 0 refills | Status: AC

## 2021-03-10 NOTE — Telephone Encounter (Signed)
Patient called and made aware of new pescriptions sent in to pharmacy. Patient voiced understanding.

## 2021-03-10 NOTE — Telephone Encounter (Signed)
Patient called office this am with complaints of increase in frequency and intermittent dysuria.   Patient on maintenance antibiotic of Augmentin. Pt requesting to increased Augmentin dose.   Educated patient she could have an acute breakthrough infection and we would need to have urine specimen to decide if she needs short term antibiotic to treat symptoms. Pt voice understanding.   Pt educated if urine culture negative then possible Dr. Alyson Ingles could write Rx for her urinary frequency.   Pt scheduled for lab visit today.

## 2021-03-12 ENCOUNTER — Telehealth: Payer: Self-pay

## 2021-03-13 ENCOUNTER — Other Ambulatory Visit: Payer: Self-pay

## 2021-03-17 ENCOUNTER — Other Ambulatory Visit: Payer: Self-pay

## 2021-03-17 DIAGNOSIS — B379 Candidiasis, unspecified: Secondary | ICD-10-CM

## 2021-03-17 LAB — URINE CULTURE

## 2021-03-17 MED ORDER — FLUCONAZOLE 100 MG PO TABS: 100.0000 mg | ORAL_TABLET | Freq: Every day | ORAL | 0 refills | Status: DC

## 2021-03-17 NOTE — Progress Notes (Signed)
Patient called and made aware.

## 2021-03-23 ENCOUNTER — Other Ambulatory Visit: Payer: Self-pay

## 2021-03-23 ENCOUNTER — Telehealth: Payer: Self-pay

## 2021-03-23 DIAGNOSIS — B379 Candidiasis, unspecified: Secondary | ICD-10-CM

## 2021-03-23 NOTE — Telephone Encounter (Signed)
Pt called asking if she could have another prescription for Diflucan. Spoke with Dr. Alyson Ingles. He said that was too much Diflucan so he wants a urine specimen to see if yeast is still in urine. Pt notified and she sais it was not a uti but she would bring specimen tomorrow.

## 2021-03-23 NOTE — Telephone Encounter (Signed)
Pt called again to say that Dr. Alyson Ingles was worried about her liver for the Diflucan but she just had liver test done on 3/25 and all were normal. I spoke with Dr. Alyson Ingles again and he said still to have urine done first. Pt said she could not wait and he stated for her to go to ER. Pt said if she did not go did he still want specimen? I explained yes he did. Pt expressed understanding.

## 2021-03-24 ENCOUNTER — Other Ambulatory Visit: Payer: Commercial Managed Care - PPO

## 2021-03-24 ENCOUNTER — Other Ambulatory Visit: Payer: Self-pay

## 2021-03-24 LAB — URINALYSIS, ROUTINE W REFLEX MICROSCOPIC
Bilirubin, UA: NEGATIVE
Glucose, UA: NEGATIVE
Ketones, UA: NEGATIVE
Nitrite, UA: NEGATIVE
Protein,UA: NEGATIVE
RBC, UA: NEGATIVE
Specific Gravity, UA: 1.02 (ref 1.005–1.030)
Urobilinogen, Ur: 0.2 mg/dL (ref 0.2–1.0)
pH, UA: 8.5 — ABNORMAL HIGH (ref 5.0–7.5)

## 2021-03-27 LAB — CULTURE, URINE COMPREHENSIVE

## 2021-03-27 NOTE — Progress Notes (Signed)
Attempted to call pt to give result. Left message.

## 2021-03-30 ENCOUNTER — Telehealth: Payer: Self-pay

## 2021-03-30 NOTE — Progress Notes (Signed)
Pt called to give urine culture results. Left message.

## 2021-03-30 NOTE — Telephone Encounter (Signed)
Pt called and left message then I sent message to Dr. Alyson Ingles and would return call when response given.

## 2021-03-30 NOTE — Progress Notes (Signed)
Pt was called . Left message to call back.

## 2021-04-06 NOTE — Telephone Encounter (Signed)
Please see last note

## 2021-05-20 ENCOUNTER — Telehealth: Payer: Self-pay

## 2021-05-26 ENCOUNTER — Other Ambulatory Visit: Payer: Self-pay

## 2021-05-26 DIAGNOSIS — R350 Frequency of micturition: Secondary | ICD-10-CM

## 2021-05-26 MED ORDER — MIRABEGRON ER 25 MG PO TB24: 25.0000 mg | ORAL_TABLET | Freq: Every day | ORAL | 0 refills | Status: DC

## 2021-05-26 NOTE — Telephone Encounter (Signed)
Left message samples left at front desk for pt.

## 2021-07-06 ENCOUNTER — Other Ambulatory Visit: Payer: Self-pay

## 2021-07-06 ENCOUNTER — Other Ambulatory Visit: Payer: Commercial Managed Care - PPO

## 2021-07-06 DIAGNOSIS — R350 Frequency of micturition: Secondary | ICD-10-CM

## 2021-07-06 LAB — URINALYSIS, ROUTINE W REFLEX MICROSCOPIC
Bilirubin, UA: NEGATIVE
Glucose, UA: NEGATIVE
Ketones, UA: NEGATIVE
Nitrite, UA: NEGATIVE
Protein,UA: NEGATIVE
RBC, UA: NEGATIVE
Specific Gravity, UA: 1.015 (ref 1.005–1.030)
Urobilinogen, Ur: 0.2 mg/dL (ref 0.2–1.0)
pH, UA: 8.5 — ABNORMAL HIGH (ref 5.0–7.5)

## 2021-07-09 LAB — URINE CULTURE

## 2021-07-10 ENCOUNTER — Other Ambulatory Visit: Payer: Self-pay

## 2021-07-10 ENCOUNTER — Ambulatory Visit (INDEPENDENT_AMBULATORY_CARE_PROVIDER_SITE_OTHER): Payer: Commercial Managed Care - PPO

## 2021-07-10 ENCOUNTER — Telehealth: Payer: Self-pay

## 2021-07-10 DIAGNOSIS — N3000 Acute cystitis without hematuria: Secondary | ICD-10-CM

## 2021-07-10 MED ORDER — GENTAMICIN SULFATE 40 MG/ML IJ SOLN
160.0000 mg | Freq: Once | INTRAMUSCULAR | Status: AC
  Administered 2021-07-10: 160 mg via INTRAMUSCULAR

## 2021-07-10 NOTE — Telephone Encounter (Signed)
Returned call. No answer. Left message to return

## 2021-07-10 NOTE — Telephone Encounter (Signed)
Patient is calling urine culture results- still having symptoms.

## 2021-07-10 NOTE — Progress Notes (Signed)
Patient here for IM gentamicin per Dr. Alyson Ingles from recent urine culture result.  Patient will have IM gent x 3 days.  First dose given today. Pt tolerated with no issues.   Patient to return next Monday, Tuesday to complete dosage.

## 2021-07-10 NOTE — Telephone Encounter (Signed)
Spoke with patient- appt created for today to start 3 day dose.

## 2021-07-13 ENCOUNTER — Other Ambulatory Visit: Payer: Self-pay

## 2021-07-13 ENCOUNTER — Ambulatory Visit (INDEPENDENT_AMBULATORY_CARE_PROVIDER_SITE_OTHER): Payer: Commercial Managed Care - PPO

## 2021-07-13 DIAGNOSIS — N3000 Acute cystitis without hematuria: Secondary | ICD-10-CM | POA: Diagnosis not present

## 2021-07-13 MED ORDER — GENTAMICIN SULFATE 40 MG/ML IJ SOLN
160.0000 mg | Freq: Once | INTRAMUSCULAR | Status: AC
  Administered 2021-07-13: 160 mg via INTRAMUSCULAR

## 2021-07-13 NOTE — Progress Notes (Signed)
Patient here today for 2nd dose of IM gentamycin.  Tolerated injection without difficulty.  Patient to return tomorrow for 3rd and final dose.

## 2021-07-14 ENCOUNTER — Ambulatory Visit (INDEPENDENT_AMBULATORY_CARE_PROVIDER_SITE_OTHER): Payer: Commercial Managed Care - PPO

## 2021-07-14 ENCOUNTER — Other Ambulatory Visit: Payer: Self-pay

## 2021-07-14 DIAGNOSIS — N3000 Acute cystitis without hematuria: Secondary | ICD-10-CM | POA: Diagnosis not present

## 2021-07-14 MED ORDER — GENTAMICIN SULFATE 40 MG/ML IJ SOLN
160.0000 mg | Freq: Once | INTRAMUSCULAR | Status: AC
  Administered 2021-07-14: 160 mg via INTRAMUSCULAR

## 2021-07-14 NOTE — Progress Notes (Signed)
Patient here today for 3rd dose of Gentamycin.  Tolerated injection without difficulty.   Patient to f/u with urine culture in 2 weeks.

## 2021-07-15 ENCOUNTER — Telehealth: Payer: Self-pay

## 2021-07-15 DIAGNOSIS — N3000 Acute cystitis without hematuria: Secondary | ICD-10-CM

## 2021-07-15 NOTE — Telephone Encounter (Signed)
Patient called back today after receiving 3 doses of IM gentamycin and still having UTI symptoms.   Reviewed with Dr. Alyson Ingles. Pt will come tomorrow to leave urine specimen for culture.

## 2021-07-16 ENCOUNTER — Other Ambulatory Visit: Payer: Commercial Managed Care - PPO

## 2021-07-16 ENCOUNTER — Other Ambulatory Visit: Payer: Self-pay

## 2021-07-18 LAB — URINE CULTURE

## 2021-07-20 ENCOUNTER — Other Ambulatory Visit: Payer: Self-pay

## 2021-07-20 ENCOUNTER — Telehealth: Payer: Self-pay

## 2021-07-20 DIAGNOSIS — N3020 Other chronic cystitis without hematuria: Secondary | ICD-10-CM

## 2021-07-20 MED ORDER — PHENAZOPYRIDINE HCL 200 MG PO TABS: 200.0000 mg | ORAL_TABLET | Freq: Three times a day (TID) | ORAL | 0 refills | Status: DC | PRN

## 2021-07-20 NOTE — Telephone Encounter (Signed)
Patient made aware of negative urine culture. Patient request rx for pyridium due to dysuria. She state AZO has not been helping. Patient also request to have f/u with cysto. Please advise.

## 2021-07-22 ENCOUNTER — Other Ambulatory Visit: Payer: Self-pay

## 2021-07-22 ENCOUNTER — Other Ambulatory Visit: Payer: Commercial Managed Care - PPO

## 2021-07-22 DIAGNOSIS — R350 Frequency of micturition: Secondary | ICD-10-CM

## 2021-07-22 LAB — URINALYSIS, ROUTINE W REFLEX MICROSCOPIC
Bilirubin, UA: NEGATIVE
Ketones, UA: NEGATIVE
Nitrite, UA: POSITIVE — AB
Protein,UA: NEGATIVE
RBC, UA: NEGATIVE
Specific Gravity, UA: 1.02 (ref 1.005–1.030)
Urobilinogen, Ur: 0.2 mg/dL (ref 0.2–1.0)
pH, UA: 7 (ref 5.0–7.5)

## 2021-07-24 ENCOUNTER — Ambulatory Visit
Admission: RE | Admit: 2021-07-24 | Discharge: 2021-07-24 | Disposition: A | Discharge status: Home / Self Care | Payer: Commercial Managed Care - PPO | Source: Ambulatory Visit | Attending: Oncology | Admitting: Oncology

## 2021-07-24 ENCOUNTER — Other Ambulatory Visit: Payer: Self-pay

## 2021-07-24 DIAGNOSIS — Z1231 Encounter for screening mammogram for malignant neoplasm of breast: Secondary | ICD-10-CM

## 2021-07-25 LAB — URINE CULTURE

## 2021-07-28 ENCOUNTER — Other Ambulatory Visit: Payer: Self-pay

## 2021-07-28 ENCOUNTER — Ambulatory Visit (INDEPENDENT_AMBULATORY_CARE_PROVIDER_SITE_OTHER): Payer: Commercial Managed Care - PPO

## 2021-07-28 ENCOUNTER — Telehealth: Payer: Self-pay

## 2021-07-28 ENCOUNTER — Other Ambulatory Visit: Payer: Commercial Managed Care - PPO

## 2021-07-28 DIAGNOSIS — N3020 Other chronic cystitis without hematuria: Secondary | ICD-10-CM | POA: Diagnosis not present

## 2021-07-28 MED ORDER — GENTAMICIN SULFATE 40 MG/ML IJ SOLN: 160.0000 mg | Freq: Every day | INTRAMUSCULAR | 1 refills | Status: DC

## 2021-07-28 MED ORDER — GENTAMICIN SULFATE 40 MG/ML IJ SOLN
160.0000 mg | Freq: Once | INTRAMUSCULAR | Status: AC
  Administered 2021-07-28: 160 mg via INTRAMUSCULAR

## 2021-07-28 NOTE — Patient Instructions (Signed)

## 2021-07-28 NOTE — Addendum Note (Signed)
Addended by: Pollyann Kennedy F on: 07/28/2021 04:54 PM   Modules accepted: Orders

## 2021-07-28 NOTE — Telephone Encounter (Signed)
Patient made aware of positive urine culture. Per Dr. Alyson Ingles patient is to get gentamicin 160 mg IM x 5 days. Patient will receive 3 injections in office and will give last 2 injections at home due to office being closed. IM teaching will be provided at nurse visit today. Rx sent to pharmacy.

## 2021-07-28 NOTE — Progress Notes (Signed)
Patient here for IM gentamicin per Dr. Alyson Ingles from recent urine culture result.   Patient will have IM gent x 5 days.   First dose given today. Pt tolerated with no issues.   Patient to return Wednesday and Thursday. She will give herself the injections at home on Friday and Saturday due to office being closed to complete the dosage.  Patient will f/u with urine culture 2 weeks after completion of dose.

## 2021-07-28 NOTE — Addendum Note (Signed)
Addended by: Iris Pert on: 07/28/2021 05:05 PM   Modules accepted: Orders

## 2021-07-29 ENCOUNTER — Other Ambulatory Visit: Payer: Self-pay | Admitting: Urology

## 2021-07-29 ENCOUNTER — Ambulatory Visit (INDEPENDENT_AMBULATORY_CARE_PROVIDER_SITE_OTHER): Payer: Commercial Managed Care - PPO

## 2021-07-29 DIAGNOSIS — N3020 Other chronic cystitis without hematuria: Secondary | ICD-10-CM

## 2021-07-29 DIAGNOSIS — N3000 Acute cystitis without hematuria: Secondary | ICD-10-CM

## 2021-07-29 MED ORDER — GENTAMICIN SULFATE 40 MG/ML IJ SOLN
160.0000 mg | Freq: Once | INTRAMUSCULAR | Status: AC
  Administered 2021-07-29: 160 mg via INTRAMUSCULAR

## 2021-07-29 NOTE — Progress Notes (Signed)
Patient here today for 2nd dose of gentamycin IM for recent urine culture. Received injection with no difficulty of '160mg'$  Gentamycin  Patient will receive a total of 5 injections for antibiotic therapy over 5 days.

## 2021-07-30 ENCOUNTER — Telehealth: Payer: Self-pay

## 2021-07-30 ENCOUNTER — Other Ambulatory Visit: Payer: Self-pay

## 2021-07-30 ENCOUNTER — Ambulatory Visit (INDEPENDENT_AMBULATORY_CARE_PROVIDER_SITE_OTHER): Payer: Commercial Managed Care - PPO

## 2021-07-30 DIAGNOSIS — N3000 Acute cystitis without hematuria: Secondary | ICD-10-CM

## 2021-07-30 DIAGNOSIS — N3020 Other chronic cystitis without hematuria: Secondary | ICD-10-CM

## 2021-07-30 MED ORDER — GENTAMICIN SULFATE 40 MG/ML IJ SOLN
160.0000 mg | Freq: Once | INTRAMUSCULAR | Status: AC
  Administered 2021-07-30: 160 mg via INTRAMUSCULAR

## 2021-07-30 MED ORDER — GENTAMICIN SULFATE 40 MG/ML IJ SOLN: 160.0000 mg | Freq: Every day | INTRAMUSCULAR | 1 refills | Status: DC

## 2021-07-30 NOTE — Progress Notes (Signed)
Patient here for 3rd IM gentamicin injection   Pt tolerated injection with no issues.   She will give herself the injections at home on Friday and Saturday due to office being closed to complete the dosage.

## 2021-07-30 NOTE — Telephone Encounter (Signed)
Patient called to let [facility] know to  have doctor to send [place].  She said she would know.  Thanks, Helene Kelp

## 2021-07-30 NOTE — Patient Instructions (Signed)

## 2021-07-30 NOTE — Telephone Encounter (Signed)
Rx sent 

## 2021-08-05 ENCOUNTER — Telehealth: Payer: Self-pay

## 2021-08-05 NOTE — Telephone Encounter (Signed)
Patient called and left message Recent UTI symptoms still present despite 5 days of Gentamycin IM for recent urine culture.

## 2021-08-07 ENCOUNTER — Other Ambulatory Visit: Payer: Self-pay | Admitting: Urology

## 2021-08-07 ENCOUNTER — Other Ambulatory Visit: Payer: Self-pay

## 2021-08-07 DIAGNOSIS — N3020 Other chronic cystitis without hematuria: Secondary | ICD-10-CM

## 2021-08-07 NOTE — Progress Notes (Signed)
Opened in error

## 2021-08-10 ENCOUNTER — Telehealth: Payer: Self-pay

## 2021-08-10 NOTE — Telephone Encounter (Signed)
Patient called this am and left VM that [facility] attempted to schedule patient picc line but patient was confused why she had to go to [facility] for picc placement.   I returned patient call and left detailed message PICC line would be place at [facility] and she would receive her antibiotic treatment at [facility] outpatient once PICC line is placed.

## 2021-08-11 ENCOUNTER — Other Ambulatory Visit: Payer: Commercial Managed Care - PPO

## 2021-08-11 ENCOUNTER — Other Ambulatory Visit: Payer: Self-pay

## 2021-08-11 NOTE — Telephone Encounter (Signed)
Reviewed patient chart and recent urine culture with patient.  Last urine culture 07/22/2021. Patient had received 5 days of IM gentamycin after the 07/22/2021 in office but did no follow up with a repeat urine culture. Patient will come today to leave another urine culture to ensure culture is still positive and picc line is needed.  Pt voiced understanding.

## 2021-08-12 ENCOUNTER — Other Ambulatory Visit (HOSPITAL_COMMUNITY): Payer: Commercial Managed Care - PPO

## 2021-08-14 ENCOUNTER — Telehealth: Payer: Self-pay

## 2021-08-14 LAB — URINE CULTURE

## 2021-08-14 NOTE — Telephone Encounter (Signed)
Patient called office stating picc line will be placed 08/18/21.  OR scheduler called and made aware. Patient will begin treatment on 8/24 @ 9:45 a.m.   Patient called, made aware, and voiced understanding.

## 2021-08-14 NOTE — Telephone Encounter (Signed)
Urine culture sent to MD.  Patient is to have Ertapenum IV  (pharmacy to dose) qd x 7 days.  OR scheduler Hoyle Sauer) notified   Patient called and made aware to schedule picc line placement.

## 2021-08-15 ENCOUNTER — Encounter (HOSPITAL_COMMUNITY): Payer: Commercial Managed Care - PPO

## 2021-08-17 ENCOUNTER — Other Ambulatory Visit: Payer: Commercial Managed Care - PPO

## 2021-08-18 ENCOUNTER — Other Ambulatory Visit (HOSPITAL_COMMUNITY): Payer: Self-pay | Admitting: Urology

## 2021-08-18 ENCOUNTER — Other Ambulatory Visit: Payer: Self-pay

## 2021-08-18 ENCOUNTER — Ambulatory Visit (HOSPITAL_COMMUNITY)
Admission: RE | Admit: 2021-08-18 | Discharge: 2021-08-18 | Disposition: A | Discharge status: Home / Self Care | Payer: Commercial Managed Care - PPO | Source: Ambulatory Visit | Attending: Urology | Admitting: Urology

## 2021-08-18 DIAGNOSIS — N3020 Other chronic cystitis without hematuria: Secondary | ICD-10-CM | POA: Insufficient documentation

## 2021-08-18 DIAGNOSIS — Z1624 Resistance to multiple antibiotics: Secondary | ICD-10-CM | POA: Insufficient documentation

## 2021-08-18 MED ORDER — HEPARIN SOD (PORK) LOCK FLUSH 100 UNIT/ML IV SOLN
INTRAVENOUS | Status: AC
  Administered 2021-08-18: 5 [IU]
  Filled 2021-08-18: qty 5

## 2021-08-18 MED ORDER — LIDOCAINE HCL 1 % IJ SOLN
INTRAMUSCULAR | Status: AC
  Administered 2021-08-18: 2 mL via SUBCUTANEOUS
  Filled 2021-08-18: qty 20

## 2021-08-19 ENCOUNTER — Encounter (HOSPITAL_COMMUNITY)
Admission: RE | Admit: 2021-08-19 | Discharge: 2021-08-19 | Disposition: A | Discharge status: Home / Self Care | Payer: Commercial Managed Care - PPO | Source: Ambulatory Visit | Attending: Urology | Admitting: Urology

## 2021-08-19 DIAGNOSIS — N3020 Other chronic cystitis without hematuria: Secondary | ICD-10-CM | POA: Insufficient documentation

## 2021-08-19 MED ORDER — SODIUM CHLORIDE 0.9 % IV SOLN: Freq: Once | INTRAVENOUS | Status: AC

## 2021-08-19 MED ORDER — SODIUM CHLORIDE 0.9 % IV SOLN
1.0000 g | Freq: Once | INTRAVENOUS | Status: AC
  Administered 2021-08-19: 1000 mg via INTRAVENOUS
  Filled 2021-08-19: qty 1

## 2021-08-19 MED ORDER — HEPARIN SOD (PORK) LOCK FLUSH 100 UNIT/ML IV SOLN
250.0000 [IU] | INTRAVENOUS | Status: AC | PRN
  Administered 2021-08-19: 250 [IU]
  Filled 2021-08-19: qty 5

## 2021-08-19 MED ORDER — SODIUM CHLORIDE 0.9% FLUSH: 10.0000 mL | INTRAVENOUS | Status: DC | PRN

## 2021-08-20 ENCOUNTER — Other Ambulatory Visit: Payer: Self-pay

## 2021-08-20 ENCOUNTER — Encounter (HOSPITAL_COMMUNITY)
Admission: RE | Admit: 2021-08-20 | Discharge: 2021-08-20 | Disposition: A | Discharge status: Home / Self Care | Payer: Commercial Managed Care - PPO | Source: Ambulatory Visit | Attending: Urology | Admitting: Urology

## 2021-08-20 DIAGNOSIS — N3020 Other chronic cystitis without hematuria: Secondary | ICD-10-CM | POA: Diagnosis not present

## 2021-08-20 MED ORDER — HEPARIN SOD (PORK) LOCK FLUSH 100 UNIT/ML IV SOLN
INTRAVENOUS | Status: AC
  Filled 2021-08-20: qty 5

## 2021-08-20 MED ORDER — SODIUM CHLORIDE 0.9 % IV SOLN
1.0000 g | Freq: Once | INTRAVENOUS | Status: AC
  Administered 2021-08-20: 1000 mg via INTRAVENOUS
  Filled 2021-08-20: qty 1

## 2021-08-20 MED ORDER — SODIUM CHLORIDE 0.9 % IV SOLN: Freq: Once | INTRAVENOUS | Status: DC

## 2021-08-20 MED ORDER — SODIUM CHLORIDE 0.9 % IV SOLN: Freq: Once | INTRAVENOUS | Status: AC

## 2021-08-20 MED ORDER — SODIUM CHLORIDE 0.9 % IV SOLN: 1.0000 g | Freq: Once | INTRAVENOUS | Status: DC

## 2021-08-20 MED ORDER — HEPARIN SOD (PORK) LOCK FLUSH 100 UNIT/ML IV SOLN: 250.0000 [IU] | INTRAVENOUS | Status: DC | PRN

## 2021-08-21 ENCOUNTER — Encounter (HOSPITAL_COMMUNITY)
Admission: RE | Admit: 2021-08-21 | Discharge: 2021-08-21 | Disposition: A | Discharge status: Home / Self Care | Payer: Commercial Managed Care - PPO | Source: Ambulatory Visit | Attending: Urology | Admitting: Urology

## 2021-08-21 ENCOUNTER — Telehealth: Payer: Self-pay

## 2021-08-21 DIAGNOSIS — N3020 Other chronic cystitis without hematuria: Secondary | ICD-10-CM | POA: Diagnosis not present

## 2021-08-21 MED ORDER — HEPARIN SOD (PORK) LOCK FLUSH 100 UNIT/ML IV SOLN
250.0000 [IU] | INTRAVENOUS | Status: AC | PRN
  Administered 2021-08-21: 250 [IU]

## 2021-08-21 MED ORDER — SODIUM CHLORIDE 0.9 % IV SOLN
1.0000 g | INTRAVENOUS | Status: DC
  Administered 2021-08-21: 1000 mg via INTRAVENOUS

## 2021-08-21 MED ORDER — SODIUM CHLORIDE 0.9 % IV SOLN
INTRAVENOUS | Status: AC
  Filled 2021-08-21: qty 1

## 2021-08-21 MED ORDER — SODIUM CHLORIDE 0.9 % IV SOLN: Freq: Once | INTRAVENOUS | Status: AC

## 2021-08-21 MED ORDER — SODIUM CHLORIDE 0.9% FLUSH: 10.0000 mL | INTRAVENOUS | Status: DC | PRN

## 2021-08-21 NOTE — Telephone Encounter (Signed)
Patient wanting to let Dr. Alyson Ingles know she is doing much better.  The medication must have started working.  Thanks, Helene Kelp

## 2021-08-22 ENCOUNTER — Other Ambulatory Visit: Payer: Self-pay

## 2021-08-22 ENCOUNTER — Encounter (HOSPITAL_COMMUNITY)
Admission: RE | Admit: 2021-08-22 | Discharge: 2021-08-22 | Disposition: A | Discharge status: Home / Self Care | Payer: Commercial Managed Care - PPO | Source: Ambulatory Visit | Attending: Urology | Admitting: Urology

## 2021-08-22 MED ORDER — SODIUM CHLORIDE 0.9 % IV SOLN
1.0000 g | Freq: Once | INTRAVENOUS | Status: DC
  Filled 2021-08-22: qty 1

## 2021-08-22 MED ORDER — HEPARIN SOD (PORK) LOCK FLUSH 100 UNIT/ML IV SOLN: 250.0000 [IU] | INTRAVENOUS | Status: DC | PRN

## 2021-08-22 MED ORDER — SODIUM CHLORIDE 0.9 % IV SOLN: Freq: Once | INTRAVENOUS | Status: DC

## 2021-08-23 ENCOUNTER — Encounter (HOSPITAL_COMMUNITY)
Admission: RE | Admit: 2021-08-23 | Discharge: 2021-08-23 | Disposition: A | Discharge status: Home / Self Care | Payer: Commercial Managed Care - PPO | Source: Ambulatory Visit | Attending: Urology | Admitting: Urology

## 2021-08-24 ENCOUNTER — Encounter (HOSPITAL_COMMUNITY)
Admission: RE | Admit: 2021-08-24 | Discharge: 2021-08-24 | Disposition: A | Discharge status: Home / Self Care | Payer: Commercial Managed Care - PPO | Source: Ambulatory Visit | Attending: Urology | Admitting: Urology

## 2021-08-24 ENCOUNTER — Other Ambulatory Visit: Payer: Self-pay

## 2021-08-24 DIAGNOSIS — N3020 Other chronic cystitis without hematuria: Secondary | ICD-10-CM | POA: Diagnosis not present

## 2021-08-24 MED ORDER — SODIUM CHLORIDE 0.9 % IV SOLN
INTRAVENOUS | Status: AC
  Filled 2021-08-24: qty 1

## 2021-08-24 MED ORDER — SODIUM CHLORIDE 0.9% FLUSH
10.0000 mL | INTRAVENOUS | Status: AC | PRN
  Administered 2021-08-24: 10 mL
  Filled 2021-08-24: qty 10

## 2021-08-24 MED ORDER — SODIUM CHLORIDE 0.9 % IV SOLN
Freq: Once | INTRAVENOUS | Status: AC
  Administered 2021-08-24: 250 mL via INTRAVENOUS

## 2021-08-24 MED ORDER — HEPARIN SOD (PORK) LOCK FLUSH 100 UNIT/ML IV SOLN
INTRAVENOUS | Status: AC
  Filled 2021-08-24: qty 5

## 2021-08-24 MED ORDER — SODIUM CHLORIDE 0.9 % IV SOLN
1.0000 g | Freq: Once | INTRAVENOUS | Status: AC
  Administered 2021-08-24: 1000 mg via INTRAVENOUS

## 2021-08-24 MED ORDER — HEPARIN SOD (PORK) LOCK FLUSH 100 UNIT/ML IV SOLN
250.0000 [IU] | INTRAVENOUS | Status: AC | PRN
  Administered 2021-08-24: 250 [IU]

## 2021-08-24 MED ORDER — SODIUM CHLORIDE 0.9% FLUSH: 10.0000 mL | INTRAVENOUS | Status: DC | PRN

## 2021-08-25 ENCOUNTER — Encounter (HOSPITAL_COMMUNITY)
Admission: RE | Admit: 2021-08-25 | Discharge: 2021-08-25 | Disposition: A | Discharge status: Home / Self Care | Payer: Commercial Managed Care - PPO | Source: Ambulatory Visit | Attending: Urology | Admitting: Urology

## 2021-08-25 DIAGNOSIS — N3020 Other chronic cystitis without hematuria: Secondary | ICD-10-CM | POA: Diagnosis not present

## 2021-08-25 MED ORDER — SODIUM CHLORIDE 0.9 % IV SOLN: Freq: Once | INTRAVENOUS | Status: AC

## 2021-08-25 MED ORDER — HEPARIN SOD (PORK) LOCK FLUSH 100 UNIT/ML IV SOLN
250.0000 [IU] | INTRAVENOUS | Status: AC | PRN
  Administered 2021-08-25: 250 [IU]

## 2021-08-25 MED ORDER — SODIUM CHLORIDE 0.9 % IV SOLN
1.0000 g | Freq: Once | INTRAVENOUS | Status: AC
  Administered 2021-08-25: 1000 mg via INTRAVENOUS

## 2021-08-25 MED ORDER — SODIUM CHLORIDE 0.9% FLUSH
10.0000 mL | INTRAVENOUS | Status: AC | PRN
  Administered 2021-08-25: 10 mL

## 2021-08-26 ENCOUNTER — Encounter (HOSPITAL_COMMUNITY): Payer: Commercial Managed Care - PPO

## 2021-08-26 ENCOUNTER — Ambulatory Visit (HOSPITAL_COMMUNITY): Payer: Commercial Managed Care - PPO

## 2021-08-26 ENCOUNTER — Other Ambulatory Visit: Payer: Commercial Managed Care - PPO

## 2021-08-26 ENCOUNTER — Other Ambulatory Visit: Payer: Self-pay

## 2021-08-26 DIAGNOSIS — N3020 Other chronic cystitis without hematuria: Secondary | ICD-10-CM

## 2021-08-26 LAB — URINALYSIS, ROUTINE W REFLEX MICROSCOPIC
Bilirubin, UA: NEGATIVE
Glucose, UA: NEGATIVE
Ketones, UA: NEGATIVE
Nitrite, UA: NEGATIVE
Protein,UA: NEGATIVE
Specific Gravity, UA: 1.025 (ref 1.005–1.030)
Urobilinogen, Ur: 0.2 mg/dL (ref 0.2–1.0)
pH, UA: 6 (ref 5.0–7.5)

## 2021-08-26 LAB — MICROSCOPIC EXAMINATION
Renal Epithel, UA: NONE SEEN /hpf
WBC, UA: >30 /hpf — AB (ref 0–5)

## 2021-08-28 LAB — URINE CULTURE

## 2021-09-01 ENCOUNTER — Other Ambulatory Visit: Payer: Commercial Managed Care - PPO

## 2021-09-01 ENCOUNTER — Other Ambulatory Visit: Payer: Self-pay

## 2021-09-01 DIAGNOSIS — N3020 Other chronic cystitis without hematuria: Secondary | ICD-10-CM

## 2021-09-01 LAB — URINALYSIS, ROUTINE W REFLEX MICROSCOPIC
Bilirubin, UA: NEGATIVE
Ketones, UA: NEGATIVE
Nitrite, UA: POSITIVE — AB
Specific Gravity, UA: 1.02 (ref 1.005–1.030)
Urobilinogen, Ur: 1 mg/dL (ref 0.2–1.0)
pH, UA: 5.5 (ref 5.0–7.5)

## 2021-09-01 LAB — MICROSCOPIC EXAMINATION
Epithelial Cells (non renal): NONE SEEN /hpf (ref 0–10)
Renal Epithel, UA: NONE SEEN /hpf
WBC, UA: >30 /hpf — AB (ref 0–5)

## 2021-09-03 NOTE — Progress Notes (Signed)
Sent via mychart

## 2021-09-04 ENCOUNTER — Ambulatory Visit (HOSPITAL_COMMUNITY)
Admission: RE | Admit: 2021-09-04 | Discharge: 2021-09-04 | Disposition: A | Discharge status: Home / Self Care | Payer: Commercial Managed Care - PPO | Source: Ambulatory Visit | Attending: Urology | Admitting: Urology

## 2021-09-04 ENCOUNTER — Other Ambulatory Visit: Payer: Self-pay

## 2021-09-04 DIAGNOSIS — Z452 Encounter for adjustment and management of vascular access device: Secondary | ICD-10-CM | POA: Insufficient documentation

## 2021-09-04 DIAGNOSIS — N3020 Other chronic cystitis without hematuria: Secondary | ICD-10-CM

## 2021-09-04 LAB — URINE CULTURE

## 2021-09-04 MED ORDER — CIPROFLOXACIN HCL 500 MG PO TABS: 500.0000 mg | ORAL_TABLET | Freq: Two times a day (BID) | ORAL | 0 refills | Status: DC

## 2021-09-07 ENCOUNTER — Other Ambulatory Visit: Payer: Self-pay | Admitting: Urology

## 2021-09-07 ENCOUNTER — Other Ambulatory Visit: Payer: Commercial Managed Care - PPO

## 2021-09-07 ENCOUNTER — Other Ambulatory Visit: Payer: Self-pay

## 2021-09-07 DIAGNOSIS — N3000 Acute cystitis without hematuria: Secondary | ICD-10-CM

## 2021-09-07 DIAGNOSIS — N3021 Other chronic cystitis with hematuria: Secondary | ICD-10-CM

## 2021-09-07 LAB — URINALYSIS, ROUTINE W REFLEX MICROSCOPIC
Bilirubin, UA: NEGATIVE
Glucose, UA: NEGATIVE
Ketones, UA: NEGATIVE
Nitrite, UA: NEGATIVE
Protein,UA: NEGATIVE
Specific Gravity, UA: >1.03 — ABNORMAL HIGH (ref 1.005–1.030)
Urobilinogen, Ur: 0.2 mg/dL (ref 0.2–1.0)
pH, UA: 5 (ref 5.0–7.5)

## 2021-09-07 LAB — MICROSCOPIC EXAMINATION
Renal Epithel, UA: NONE SEEN /hpf
WBC, UA: >30 /hpf — AB (ref 0–5)

## 2021-09-07 MED ORDER — AMOXICILLIN-POT CLAVULANATE 875-125 MG PO TABS: 1.0000 | ORAL_TABLET | Freq: Two times a day (BID) | ORAL | 0 refills | Status: DC

## 2021-09-09 LAB — URINE CULTURE

## 2021-09-11 ENCOUNTER — Other Ambulatory Visit: Payer: Self-pay

## 2021-09-11 ENCOUNTER — Telehealth: Payer: Self-pay

## 2021-09-11 MED ORDER — FLUCONAZOLE 150 MG PO TABS: 150.0000 mg | ORAL_TABLET | Freq: Every day | ORAL | 0 refills | Status: AC

## 2021-09-11 NOTE — Telephone Encounter (Signed)
Patient made aware of negative urine culture, diflucan sent to pharmacy, and CT appointment date time and instructions. Patient voiced understanding.

## 2021-09-14 ENCOUNTER — Other Ambulatory Visit: Payer: Self-pay

## 2021-09-14 ENCOUNTER — Ambulatory Visit (HOSPITAL_COMMUNITY)
Admission: RE | Admit: 2021-09-14 | Discharge: 2021-09-14 | Disposition: A | Discharge status: Home / Self Care | Payer: Commercial Managed Care - PPO | Source: Ambulatory Visit | Attending: Urology | Admitting: Urology

## 2021-09-14 DIAGNOSIS — N3021 Other chronic cystitis with hematuria: Secondary | ICD-10-CM | POA: Diagnosis present

## 2021-09-14 LAB — POCT I-STAT CREATININE: Creatinine, Ser: 0.6 mg/dL (ref 0.44–1.00)

## 2021-09-14 MED ORDER — IOHEXOL 350 MG/ML SOLN
100.0000 mL | Freq: Once | INTRAVENOUS | Status: AC | PRN
  Administered 2021-09-14: 100 mL via INTRAVENOUS

## 2021-09-16 ENCOUNTER — Other Ambulatory Visit: Payer: Commercial Managed Care - PPO

## 2021-09-16 ENCOUNTER — Other Ambulatory Visit: Payer: Self-pay

## 2021-09-16 ENCOUNTER — Telehealth: Payer: Self-pay

## 2021-09-16 DIAGNOSIS — N3020 Other chronic cystitis without hematuria: Secondary | ICD-10-CM

## 2021-09-16 MED ORDER — CEFUROXIME AXETIL 250 MG PO TABS
250.0000 mg | ORAL_TABLET | Freq: Two times a day (BID) | ORAL | 0 refills | Status: DC
Start: 2021-09-16 — End: 2021-10-28

## 2021-09-16 NOTE — Progress Notes (Signed)
Patient called and made aware of rx sent to pharmacy.

## 2021-09-16 NOTE — Telephone Encounter (Signed)
Patient called complaining of throbbing and discomfort while urinating. Patient placed on lab schedule for urine drop off.

## 2021-09-18 ENCOUNTER — Telehealth: Payer: Self-pay

## 2021-09-18 LAB — URINE CULTURE: Organism ID, Bacteria: NO GROWTH

## 2021-09-18 NOTE — Telephone Encounter (Signed)
Patient left voicemail to return call.  I returned call. Patient requesting urine culture result- urine culture result negative. Patient still reports she still feels "throbbing" in pelvic. Patient is scheduled to see MD for cysto on 10/05. Patient voiced understanding.

## 2021-09-21 NOTE — Progress Notes (Signed)
Results sent via my chart 

## 2021-09-24 ENCOUNTER — Telehealth: Payer: Self-pay

## 2021-09-24 NOTE — Telephone Encounter (Signed)
Patient left a Voice Message:  She says her insurance did not get a prior auth on a visit or time of 08-19-21.  She wants to come by and pick up: Procedure Notes Progress Notes Physicians Orders  For this date of service for her to fax insurance.  Thanks, Helene Kelp

## 2021-09-28 NOTE — Telephone Encounter (Signed)
Patient left message at  office that she received paperwork she needed from [facility].

## 2021-09-30 ENCOUNTER — Other Ambulatory Visit: Payer: Self-pay

## 2021-09-30 ENCOUNTER — Ambulatory Visit (INDEPENDENT_AMBULATORY_CARE_PROVIDER_SITE_OTHER): Payer: Commercial Managed Care - PPO | Admitting: Urology

## 2021-09-30 VITALS — BP 119/73 | HR 88

## 2021-09-30 DIAGNOSIS — N3021 Other chronic cystitis with hematuria: Secondary | ICD-10-CM

## 2021-09-30 LAB — MICROSCOPIC EXAMINATION
Epithelial Cells (non renal): >10 /hpf — AB (ref 0–10)
Renal Epithel, UA: NONE SEEN /hpf
WBC, UA: >30 /hpf — AB (ref 0–5)

## 2021-09-30 LAB — URINALYSIS, ROUTINE W REFLEX MICROSCOPIC
Bilirubin, UA: NEGATIVE
Nitrite, UA: POSITIVE — AB
Specific Gravity, UA: 1.02 (ref 1.005–1.030)
Urobilinogen, Ur: >8 mg/dL — ABNORMAL HIGH (ref 0.2–1.0)
pH, UA: 8.5 — ABNORMAL HIGH (ref 5.0–7.5)

## 2021-09-30 MED ORDER — URIBEL 118 MG PO CAPS: 1.0000 | ORAL_CAPSULE | Freq: Two times a day (BID) | ORAL | 1 refills | Status: DC | PRN

## 2021-09-30 MED ORDER — CIPROFLOXACIN HCL 500 MG PO TABS
500.0000 mg | ORAL_TABLET | Freq: Once | ORAL | Status: AC
  Administered 2021-09-30: 500 mg via ORAL

## 2021-09-30 NOTE — H&P (View-Only) (Signed)
   09/30/21  CC: recurrent UTI and pelvic pain   HPI: [name] is a [id number] here for cystoscopy. She underwent CT hematuria protocol which showed bladder calculi. Blood pressure 119/73, pulse 88. NED. A&Ox3.   No respiratory distress   Abd soft, NT, ND Normal external genitalia with patent urethral meatus  Cystoscopy Procedure Note  Patient identification was confirmed, informed consent was obtained, and patient was prepped using Betadine solution.  Lidocaine jelly was administered per urethral meatus.    Procedure: - Flexible cystoscope introduced, without any difficulty.   - Thorough search of the bladder revealed:    normal urethral meatus    Edema/irrgeularity of the left lateral wall concerning for mesh erosion    Numerous 2-70mm bladder calculi    no ulcers     no tumors    no urethral polyps    no trabeculation  - Ureteral orifices were normal in position and appearance.  Post-Procedure: - Patient tolerated the procedure well  Assessment/ Plan: We discussed the management of bladder calculi and mesh erosion including cystolithalopaxy and endoscopic excision of mesh. After discussing the procedure the patient wishes to proceed with surgery. Risks/benefits/alternatives discussed   No follow-ups on file.  Nicolette Bang, MD

## 2021-09-30 NOTE — Progress Notes (Signed)
Urological Symptom Review  Patient is experiencing the following symptoms: Frequent urination Burning/pain with urination Get up at night to urinate Urinary tract infection   Review of Systems  Gastrointestinal (upper)  : Negative for upper GI symptoms  Gastrointestinal (lower) : Negative for lower GI symptoms  Constitutional : Negative for symptoms  Skin: Negative for skin symptoms  Eyes: Negative for eye symptoms  Ear/Nose/Throat : Negative for Ear/Nose/Throat symptoms  Hematologic/Lymphatic: Negative for Hematologic/Lymphatic symptoms  Cardiovascular : Negative for cardiovascular symptoms  Respiratory : Negative for respiratory symptoms  Endocrine: Negative for endocrine symptoms  Musculoskeletal: Negative for musculoskeletal symptoms  Neurological: Negative for neurological symptoms  Psychologic: Negative for psychiatric symptoms

## 2021-09-30 NOTE — Progress Notes (Signed)
   09/30/21  CC: recurrent UTI and pelvic pain   HPI: Ms Labine is a [id number] here for cystoscopy. She underwent CT hematuria protocol which showed bladder calculi. Blood pressure 119/73, pulse 88. NED. A&Ox3.   No respiratory distress   Abd soft, NT, ND Normal external genitalia with patent urethral meatus  Cystoscopy Procedure Note  Patient identification was confirmed, informed consent was obtained, and patient was prepped using Betadine solution.  Lidocaine jelly was administered per urethral meatus.    Procedure: - Flexible cystoscope introduced, without any difficulty.   - Thorough search of the bladder revealed:    normal urethral meatus    Edema/irrgeularity of the left lateral wall concerning for mesh erosion    Numerous 2-81mm bladder calculi    no ulcers     no tumors    no urethral polyps    no trabeculation  - Ureteral orifices were normal in position and appearance.  Post-Procedure: - Patient tolerated the procedure well  Assessment/ Plan: We discussed the management of bladder calculi and mesh erosion including cystolithalopaxy and endoscopic excision of mesh. After discussing the procedure the patient wishes to proceed with surgery. Risks/benefits/alternatives discussed   No follow-ups on file.  Nicolette Bang, MD

## 2021-10-01 ENCOUNTER — Telehealth: Payer: Self-pay

## 2021-10-01 NOTE — Telephone Encounter (Signed)
Patient called wanting to know how long she will be out of work after the procedure. Please advise.

## 2021-10-02 ENCOUNTER — Encounter: Payer: Self-pay | Admitting: Urology

## 2021-10-02 NOTE — Patient Instructions (Signed)
Bladder Stone A bladder stone is a buildup of crystals made from the proteins and minerals found in urine. These substances build up when urine becomes too concentrated. Urine is concentrated when there is less water and more proteins and minerals in it. Bladder stones usually develop when a person has another medical condition that prevents the bladder from emptying completely. Crystals can form in the small amount of urine that is left in the bladder. Bladder stones that grow large can become painful and may block the flow of urine. What are the causes? This condition may be caused by: An enlarged prostate, which prevents the bladder from emptying well. An infection of a part of your urinary system (urinary tract infection, or UTI). This includes the: Kidneys. Bladder. Ureters. These are the tubes that carry urine to your bladder. Urethra. This is the tube that drains urine from your bladder. A weak spot in the bladder that creates a small pouch (bladder diverticulum). Nerve damage that may interfere with the signals from your brain to your bladder muscles (neurogenic bladder). This can result from conditions such as Parkinson's disease or spinal cord injuries. What increases the risk? This condition is more likely to develop in people who: Get frequent UTIs. Have another medical condition that affects the bladder. Have a history of bladder surgery. Have a spinal cord injury. Have an abnormal shape of the bladder (deformity). What are the signs or symptoms? Common symptoms of this condition include: Pain in the abdomen. A need to urinate more often. Difficulty or pain when urinating. Blood in the urine. Cloudy urine or urine that is dark in color. Pain in the penis or testicles in men. Small bladder stones do not always cause symptoms. How is this diagnosed? This condition may be diagnosed based on your symptoms, medical history, and physical exam. The physical exam will check for  tenderness in your abdomen. For men, an exam in the rectum may be done to check the prostate gland. You may have tests, such as: A urine test (urinalysis). A urine sample test to check for other infections (culture). Blood tests, including tests to look for a certain substance (creatinine). A creatinine level that is higher than normal could indicate a blockage. A procedure to check your bladder using a scope with a camera (cystoscopy). You may also have imaging studies, such as: CT scan or ultrasound of your abdomen and the area between your hip bones (pelvis or pelvic area). An X-ray of your urinary system. How is this treated? This condition may be treated with: Cystolitholapaxy. This procedure uses a laser, ultrasound, or other device to break the stone into smaller pieces. Fluids are used to flush the small pieces from the area. Surgery to remove the stone. A stent. This is a small mesh tube that is threaded into your ureter to make urine flow. Medicines to treat pain. Follow these instructions at home: Medicines Take over-the-counter and prescription medicines only as told by your health care provider. Ask your health care provider if the medicine prescribed to you: Requires you to avoid driving or using heavy machinery. Can cause constipation. You may need to take these actions to prevent or treat constipation: Take over-the-counter or prescription medicines. Eat foods that are high in fiber, such as beans, whole grains, and fresh fruits and vegetables. Limit foods that are high in fat and processed sugars, such as fried or sweet foods. Alcohol use Do not drink alcohol if: Your health care provider tells you not to drink.  You are pregnant, may be pregnant, or are planning to become pregnant. If you drink alcohol: Limit how much you drink to: 0-1 drink a day for women. 0-2 drinks a day for men. Be aware of how much alcohol is in your drink. In the [place] one drink equals one 12  oz bottle of beer (355 mL), one 5 oz glass of wine (148 mL), or one 1 oz glass of hard liquor (44 mL). Activity Rest as told by your health care provider. Return to your normal activities as told by your health care provider. Ask your health care provider what activities are safe for you. General instructions  Drink enough fluid to keep your urine pale yellow. Tell your health care provider about any unusual symptoms related to urinating. Early diagnosis of an enlarged prostate and other bladder conditions may reduce your risk of getting bladder stones. Do not use any products that contain nicotine or tobacco, such as cigarettes, e-cigarettes, or chewing tobacco. If you need help quitting, ask your health care provider. Do not use drugs. Where to find more information [facility]): www.[facility].org Contact a health care provider if you: Have a fever. Feel nauseous or vomit. Are unable to urinate. Have a large amount of blood in your urine. Get help right away if you: Have severe back pain or pain in the lower part of your abdomen. Cannot eat or drink without vomiting. Vomit after taking your medicine. Summary A bladder stone is a buildup of crystals made from the proteins and minerals found in urine. These substances build up when urine becomes too concentrated. Bladder stones that grow large can become painful and may block the flow of urine. Bladder stones may be treated with a laser, a stent, surgery, or pain medicines. This information is not intended to replace advice given to you by your health care provider. Make sure you discuss any questions you have with your health care provider. Document Revised: 07/05/2019 Document Reviewed: 07/05/2019 Elsevier Patient Education  [place].

## 2021-10-05 NOTE — Patient Instructions (Signed)
[name]  10/05/2021     @PREFPERIOPPHARMACY @   Your procedure is scheduled on  10/12/2021.   Report to [facility] at  1045 AM.     Call this number if you have problems the morning of surgery:  [phone]   Remember:  Do not eat or drink after midnight.      Take these medicines the morning of surgery with A SIP OF WATER                                 Prilosec.    Do not wear jewelry, make-up or nail polish.  Do not wear lotions, powders, or perfumes, or deodorant.  Do not shave 48 hours prior to surgery.  Men may shave face and neck.  Do not bring valuables to the hospital.  [facility] is not responsible for any belongings or valuables.  Contacts, dentures or bridgework may not be worn into surgery.  Leave your suitcase in the car.  After surgery it may be brought to your room.  For patients admitted to the hospital, discharge time will be determined by your treatment team.  Patients discharged the day of surgery will not be allowed to drive home and must have someone with them for 24 hours.    Special instructions:   DO NOT smoke tobacco or vape fore 24 hours before your procedure.  Please read over the following fact sheets that you were given. Anesthesia Post-op Instructions and Care and Recovery After Surgery      Cystoscopy Cystoscopy is a procedure that is used to help diagnose and sometimes treat conditions that affect the lower urinary tract. The lower urinary tract includes the bladder and the urethra. The urethra is the tube that drains urine from the bladder. Cystoscopy is done using a thin, tube-shaped instrument with a light and camera at the end (cystoscope). The cystoscope may be hard or flexible, depending on the goal of the procedure. The cystoscope is inserted through the urethra, into the bladder. Cystoscopy may be recommended if you have: Urinary tract infections that keep coming back. Blood in the urine (hematuria). An  inability to control when you urinate (urinary incontinence) or an overactive bladder. Unusual cells found in a urine sample. A blockage in the urethra, such as a urinary stone. Painful urination. An abnormality in the bladder found during an intravenous pyelogram (IVP) or CT scan. Cystoscopy may also be done to remove a sample of tissue to be examined under a microscope (biopsy). Tell a health care provider about: Any allergies you have. All medicines you are taking, including vitamins, herbs, eye drops, creams, and over-the-counter medicines. Any problems you or family members have had with anesthetic medicines. Any blood disorders you have. Any surgeries you have had. Any medical conditions you have. Whether you are pregnant or may be pregnant. What are the risks? Generally, this is a safe procedure. However, problems may occur, including: Infection. Bleeding. Allergic reactions to medicines. Damage to other structures or organs. What happens before the procedure? Medicines Ask your health care provider about: Changing or stopping your regular medicines. This is especially important if you are taking diabetes medicines or blood thinners. Taking medicines such as aspirin and ibuprofen. These medicines can thin your blood. Do not take these medicines unless your health care provider tells you to take them. Taking over-the-counter medicines, vitamins, herbs, and  supplements. Tests You may have an exam or testing, such as: X-rays of the bladder, urethra, or kidneys. CT scan of the abdomen or pelvis. Urine tests to check for signs of infection. General instructions Follow instructions from your health care provider about eating or drinking restrictions. Ask your health care provider what steps will be taken to help prevent infection. These steps may include: Washing skin with a germ-killing soap. Taking antibiotic medicine. Plan to have a responsible adult take you home from the  hospital or clinic. What happens during the procedure?  You will be given one or more of the following: A medicine to help you relax (sedative). A medicine to numb the area (local anesthetic). The area around the opening of your urethra will be cleaned. The cystoscope will be passed through your urethra into your bladder. Germ-free (sterile) fluid will flow through the cystoscope to fill your bladder. The fluid will stretch your bladder so that your health care provider can clearly examine your bladder walls. Your doctor will look at the urethra and bladder. Your doctor may take a biopsy or remove stones. The cystoscope will be removed, and your bladder will be emptied. The procedure may vary among health care providers and hospitals. What can I expect after the procedure? After the procedure, it is common to have: Some soreness or pain in your abdomen and urethra. Urinary symptoms. These include: Mild pain or burning when you urinate. Pain should stop within a few minutes after you urinate. This may last for up to 1 week. A small amount of blood in your urine for several days. Feeling like you need to urinate but producing only a small amount of urine. Follow these instructions at home: Medicines Take over-the-counter and prescription medicines only as told by your health care provider. If you were prescribed an antibiotic medicine, take it as told by your health care provider. Do not stop taking the antibiotic even if you start to feel better. General instructions Return to your normal activities as told by your health care provider. Ask your health care provider what activities are safe for you. If you were given a sedative during the procedure, it can affect you for several hours. Do not drive or operate machinery until your health care provider says that it is safe. Watch for any blood in your urine. If the amount of blood in your urine increases, call your health care  provider. Follow instructions from your health care provider about eating or drinking restrictions. If a tissue sample was removed for testing (biopsy) during your procedure, it is up to you to get your test results. Ask your health care provider, or the department that is doing the test, when your results will be ready. Drink enough fluid to keep your urine pale yellow. Keep all follow-up visits. This is important. Contact a health care provider if: You have pain that gets worse or does not get better with medicine, especially pain when you urinate. You have trouble urinating. You have more blood in your urine. Get help right away if: You have blood clots in your urine. You have abdominal pain. You have a fever or chills. You are unable to urinate. Summary Cystoscopy is a procedure that is used to help diagnose and sometimes treat conditions that affect the lower urinary tract. Cystoscopy is done using a thin, tube-shaped instrument with a light and camera at the end. After the procedure, it is common to have some soreness or pain in your abdomen and  urethra. Watch for any blood in your urine. If the amount of blood in your urine increases, call your health care provider. If you were prescribed an antibiotic medicine, take it as told by your health care provider. Do not stop taking the antibiotic even if you start to feel better. This information is not intended to replace advice given to you by your health care provider. Make sure you discuss any questions you have with your health care provider. Document Revised: 07/25/2020 Document Reviewed: 07/25/2020 Elsevier Patient Education  [place] Anesthesia, Adult, Care After This sheet gives you information about how to care for yourself after your procedure. Your health care provider may also give you more specific instructions. If you have problems or questions, contact your health care provider. What can I expect after the  procedure? After the procedure, the following side effects are common: Pain or discomfort at the IV site. Nausea. Vomiting. Sore throat. Trouble concentrating. Feeling cold or chills. Feeling weak or tired. Sleepiness and fatigue. Soreness and body aches. These side effects can affect parts of the body that were not involved in surgery. Follow these instructions at home: For the time period you were told by your health care provider:  Rest. Do not participate in activities where you could fall or become injured. Do not drive or use machinery. Do not drink alcohol. Do not take sleeping pills or medicines that cause drowsiness. Do not make important decisions or sign legal documents. Do not take care of children on your own. Eating and drinking Follow any instructions from your health care provider about eating or drinking restrictions. When you feel hungry, start by eating small amounts of foods that are soft and easy to digest (bland), such as toast. Gradually return to your regular diet. Drink enough fluid to keep your urine pale yellow. If you vomit, rehydrate by drinking water, juice, or clear broth. General instructions If you have sleep apnea, surgery and certain medicines can increase your risk for breathing problems. Follow instructions from your health care provider about wearing your sleep device: Anytime you are sleeping, including during daytime naps. While taking prescription pain medicines, sleeping medicines, or medicines that make you drowsy. Have a responsible adult stay with you for the time you are told. It is important to have someone help care for you until you are awake and alert. Return to your normal activities as told by your health care provider. Ask your health care provider what activities are safe for you. Take over-the-counter and prescription medicines only as told by your health care provider. If you smoke, do not smoke without supervision. Keep all  follow-up visits as told by your health care provider. This is important. Contact a health care provider if: You have nausea or vomiting that does not get better with medicine. You cannot eat or drink without vomiting. You have pain that does not get better with medicine. You are unable to pass urine. You develop a skin rash. You have a fever. You have redness around your IV site that gets worse. Get help right away if: You have difficulty breathing. You have chest pain. You have blood in your urine or stool, or you vomit blood. Summary After the procedure, it is common to have a sore throat or nausea. It is also common to feel tired. Have a responsible adult stay with you for the time you are told. It is important to have someone help care for you until you are awake and  alert. When you feel hungry, start by eating small amounts of foods that are soft and easy to digest (bland), such as toast. Gradually return to your regular diet. Drink enough fluid to keep your urine pale yellow. Return to your normal activities as told by your health care provider. Ask your health care provider what activities are safe for you. This information is not intended to replace advice given to you by your health care provider. Make sure you discuss any questions you have with your health care provider. Document Revised: 08/28/2020 Document Reviewed: 03/27/2020 Elsevier Patient Education  2022 [place]. How to Use Chlorhexidine for Bathing Chlorhexidine gluconate (CHG) is a germ-killing (antiseptic) solution that is used to clean the skin. It can get rid of the bacteria that normally live on the skin and can keep them away for about 24 hours. To clean your skin with CHG, you may be given: A CHG solution to use in the shower or as part of a sponge bath. A prepackaged cloth that contains CHG. Cleaning your skin with CHG may help lower the risk for infection: While you are staying in the intensive care unit  of the hospital. If you have a vascular access, such as a central line, to provide short-term or long-term access to your veins. If you have a catheter to drain urine from your bladder. If you are on a ventilator. A ventilator is a machine that helps you breathe by moving air in and out of your lungs. After surgery. What are the risks? Risks of using CHG include: A skin reaction. Hearing loss, if CHG gets in your ears and you have a perforated eardrum. Eye injury, if CHG gets in your eyes and is not rinsed out. The CHG product catching fire. Make sure that you avoid smoking and flames after applying CHG to your skin. Do not use CHG: If you have a chlorhexidine allergy or have previously reacted to chlorhexidine. On babies younger than 73 months of age. How to use CHG solution Use CHG only as told by your health care provider, and follow the instructions on the label. Use the full amount of CHG as directed. Usually, this is one bottle. During a shower Follow these steps when using CHG solution during a shower (unless your health care provider gives you different instructions): Start the shower. Use your normal soap and shampoo to wash your face and hair. Turn off the shower or move out of the shower stream. Pour the CHG onto a clean washcloth. Do not use any type of brush or rough-edged sponge. Starting at your neck, lather your body down to your toes. Make sure you follow these instructions: If you will be having surgery, pay special attention to the part of your body where you will be having surgery. Scrub this area for at least 1 minute. Do not use CHG on your head or face. If the solution gets into your ears or eyes, rinse them well with water. Avoid your genital area. Avoid any areas of skin that have broken skin, cuts, or scrapes. Scrub your back and under your arms. Make sure to wash skin folds. Let the lather sit on your skin for 1-2 minutes or as long as told by your health care  provider. Thoroughly rinse your entire body in the shower. Make sure that all body creases and crevices are rinsed well. Dry off with a clean towel. Do not put any substances on your body afterward--such as powder, lotion, or perfume--unless you  are told to do so by your health care provider. Only use lotions that are recommended by the manufacturer. Put on clean clothes or pajamas. If it is the night before your surgery, sleep in clean sheets.  During a sponge bath Follow these steps when using CHG solution during a sponge bath (unless your health care provider gives you different instructions): Use your normal soap and shampoo to wash your face and hair. Pour the CHG onto a clean washcloth. Starting at your neck, lather your body down to your toes. Make sure you follow these instructions: If you will be having surgery, pay special attention to the part of your body where you will be having surgery. Scrub this area for at least 1 minute. Do not use CHG on your head or face. If the solution gets into your ears or eyes, rinse them well with water. Avoid your genital area. Avoid any areas of skin that have broken skin, cuts, or scrapes. Scrub your back and under your arms. Make sure to wash skin folds. Let the lather sit on your skin for 1-2 minutes or as long as told by your health care provider. Using a different clean, wet washcloth, thoroughly rinse your entire body. Make sure that all body creases and crevices are rinsed well. Dry off with a clean towel. Do not put any substances on your body afterward--such as powder, lotion, or perfume--unless you are told to do so by your health care provider. Only use lotions that are recommended by the manufacturer. Put on clean clothes or pajamas. If it is the night before your surgery, sleep in clean sheets. How to use CHG prepackaged cloths Only use CHG cloths as told by your health care provider, and follow the instructions on the label. Use the  CHG cloth on clean, dry skin. Do not use the CHG cloth on your head or face unless your health care provider tells you to. When washing with the CHG cloth: Avoid your genital area. Avoid any areas of skin that have broken skin, cuts, or scrapes. Before surgery Follow these steps when using a CHG cloth to clean before surgery (unless your health care provider gives you different instructions): Using the CHG cloth, vigorously scrub the part of your body where you will be having surgery. Scrub using a back-and-forth motion for 3 minutes. The area on your body should be completely wet with CHG when you are done scrubbing. Do not rinse. Discard the cloth and let the area air-dry. Do not put any substances on the area afterward, such as powder, lotion, or perfume. Put on clean clothes or pajamas. If it is the night before your surgery, sleep in clean sheets.  For general bathing Follow these steps when using CHG cloths for general bathing (unless your health care provider gives you different instructions). Use a separate CHG cloth for each area of your body. Make sure you wash between any folds of skin and between your fingers and toes. Wash your body in the following order, switching to a new cloth after each step: The front of your neck, shoulders, and chest. Both of your arms, under your arms, and your hands. Your stomach and groin area, avoiding the genitals. Your right leg and foot. Your left leg and foot. The back of your neck, your back, and your buttocks. Do not rinse. Discard the cloth and let the area air-dry. Do not put any substances on your body afterward--such as powder, lotion, or perfume--unless you are told to  do so by your health care provider. Only use lotions that are recommended by the manufacturer. Put on clean clothes or pajamas. Contact a health care provider if: Your skin gets irritated after scrubbing. You have questions about using your solution or cloth. You swallow  any chlorhexidine. Call your local poison control center (1-581 126 7349 in the [place] Get help right away if: Your eyes itch badly, or they become very red or swollen. Your skin itches badly and is red or swollen. Your hearing changes. You have trouble seeing. You have swelling or tingling in your mouth or throat. You have trouble breathing. These symptoms may represent a serious problem that is an emergency. Do not wait to see if the symptoms will go away. Get medical help right away. Call your local emergency services (911 in the [place] Do not drive yourself to the hospital. Summary Chlorhexidine gluconate (CHG) is a germ-killing (antiseptic) solution that is used to clean the skin. Cleaning your skin with CHG may help to lower your risk for infection. You may be given CHG to use for bathing. It may be in a bottle or in a prepackaged cloth to use on your skin. Carefully follow your health care provider's instructions and the instructions on the product label. Do not use CHG if you have a chlorhexidine allergy. Contact your health care provider if your skin gets irritated after scrubbing. This information is not intended to replace advice given to you by your health care provider. Make sure you discuss any questions you have with your health care provider. Document Revised: 02/23/2021 Document Reviewed: 02/23/2021 Elsevier Patient Education  2022 [employer].

## 2021-10-07 ENCOUNTER — Encounter (HOSPITAL_COMMUNITY)
Admission: RE | Admit: 2021-10-07 | Discharge: 2021-10-07 | Disposition: A | Discharge status: Home / Self Care | Payer: Commercial Managed Care - PPO | Source: Ambulatory Visit | Attending: Urology | Admitting: Urology

## 2021-10-07 ENCOUNTER — Telehealth: Payer: Self-pay | Admitting: Urology

## 2021-10-07 ENCOUNTER — Other Ambulatory Visit: Payer: Self-pay

## 2021-10-12 ENCOUNTER — Ambulatory Visit (HOSPITAL_COMMUNITY): Payer: Commercial Managed Care - PPO | Admitting: Certified Registered Nurse Anesthetist

## 2021-10-12 ENCOUNTER — Encounter (HOSPITAL_COMMUNITY): Payer: Self-pay | Admitting: Urology

## 2021-10-12 ENCOUNTER — Ambulatory Visit (HOSPITAL_COMMUNITY)
Admission: RE | Admit: 2021-10-12 | Discharge: 2021-10-12 | Disposition: A | Discharge status: Home / Self Care | Payer: Commercial Managed Care - PPO | Attending: Urology | Admitting: Urology

## 2021-10-12 ENCOUNTER — Encounter (HOSPITAL_COMMUNITY)
Admission: RE | Disposition: A | Discharge status: Home / Self Care | Payer: Self-pay | Source: Home / Self Care | Attending: Urology

## 2021-10-12 ENCOUNTER — Other Ambulatory Visit: Payer: Self-pay

## 2021-10-12 DIAGNOSIS — N3030 Trigonitis without hematuria: Secondary | ICD-10-CM | POA: Diagnosis present

## 2021-10-12 DIAGNOSIS — N210 Calculus in bladder: Secondary | ICD-10-CM

## 2021-10-12 DIAGNOSIS — Z87440 Personal history of urinary (tract) infections: Secondary | ICD-10-CM | POA: Diagnosis not present

## 2021-10-12 HISTORY — PX: CYSTOSCOPY WITH LITHOLAPAXY: SHX1425

## 2021-10-12 HISTORY — PX: CYSTOSCOPY WITH BIOPSY: SHX5122

## 2021-10-12 SURGERY — CYSTOSCOPY, WITH BLADDER CALCULUS LITHOLAPAXY
Anesthesia: General | Site: Bladder

## 2021-10-12 MED ORDER — FENTANYL CITRATE (PF) 100 MCG/2ML IJ SOLN
INTRAMUSCULAR | Status: AC
  Filled 2021-10-12: qty 2

## 2021-10-12 MED ORDER — WATER FOR IRRIGATION, STERILE IR SOLN
Status: DC | PRN
  Administered 2021-10-12: 3000 mL
  Administered 2021-10-12: 1000 mL

## 2021-10-12 MED ORDER — OXYCODONE-ACETAMINOPHEN 5-325 MG PO TABS: 1.0000 | ORAL_TABLET | ORAL | 0 refills | Status: AC | PRN

## 2021-10-12 MED ORDER — ONDANSETRON HCL 4 MG/2ML IJ SOLN: 4.0000 mg | Freq: Once | INTRAMUSCULAR | Status: DC | PRN

## 2021-10-12 MED ORDER — FENTANYL CITRATE (PF) 250 MCG/5ML IJ SOLN
INTRAMUSCULAR | Status: DC | PRN
  Administered 2021-10-12 (×4): 25 ug via INTRAVENOUS

## 2021-10-12 MED ORDER — MIDAZOLAM HCL 2 MG/2ML IJ SOLN
INTRAMUSCULAR | Status: AC
  Filled 2021-10-12: qty 2

## 2021-10-12 MED ORDER — SODIUM CHLORIDE 0.9 % IR SOLN
Status: DC | PRN
  Administered 2021-10-12: 3000 mL

## 2021-10-12 MED ORDER — ONDANSETRON HCL 4 MG/2ML IJ SOLN
INTRAMUSCULAR | Status: DC | PRN
  Administered 2021-10-12: 4 mg via INTRAVENOUS

## 2021-10-12 MED ORDER — BELLADONNA ALKALOIDS-OPIUM 16.2-60 MG RE SUPP
1.0000 | Freq: Once | RECTAL | Status: AC
  Administered 2021-10-12: 1 via RECTAL

## 2021-10-12 MED ORDER — DEXAMETHASONE SODIUM PHOSPHATE 10 MG/ML IJ SOLN
INTRAMUSCULAR | Status: DC | PRN
  Administered 2021-10-12: 10 mg via INTRAVENOUS

## 2021-10-12 MED ORDER — FENTANYL CITRATE PF 50 MCG/ML IJ SOSY: 25.0000 ug | PREFILLED_SYRINGE | INTRAMUSCULAR | Status: DC | PRN

## 2021-10-12 MED ORDER — MEPERIDINE HCL 50 MG/ML IJ SOLN: 6.2500 mg | INTRAMUSCULAR | Status: DC | PRN

## 2021-10-12 MED ORDER — ORAL CARE MOUTH RINSE: 15.0000 mL | Freq: Once | OROMUCOSAL | Status: AC

## 2021-10-12 MED ORDER — BELLADONNA ALKALOIDS-OPIUM 16.2-60 MG RE SUPP
RECTAL | Status: AC
  Filled 2021-10-12: qty 1

## 2021-10-12 MED ORDER — CHLORHEXIDINE GLUCONATE 0.12 % MT SOLN
15.0000 mL | Freq: Once | OROMUCOSAL | Status: AC
  Administered 2021-10-12: 15 mL via OROMUCOSAL

## 2021-10-12 MED ORDER — LIDOCAINE HCL (CARDIAC) PF 100 MG/5ML IV SOSY
PREFILLED_SYRINGE | INTRAVENOUS | Status: DC | PRN
  Administered 2021-10-12: 60 mg via INTRAVENOUS

## 2021-10-12 MED ORDER — PROPOFOL 10 MG/ML IV BOLUS
INTRAVENOUS | Status: DC | PRN
  Administered 2021-10-12: 200 mg via INTRAVENOUS

## 2021-10-12 MED ORDER — CEFAZOLIN SODIUM-DEXTROSE 2-4 GM/100ML-% IV SOLN
2.0000 g | INTRAVENOUS | Status: AC
  Administered 2021-10-12: 2 g via INTRAVENOUS

## 2021-10-12 MED ORDER — LACTATED RINGERS IV SOLN: INTRAVENOUS | Status: DC

## 2021-10-12 SURGICAL SUPPLY — 23 items
BAG DRAIN URO TABLE W/ADPT NS (BAG) ×3 IMPLANT
BAG DRN 8 ADPR NS SKTRN CSTL (BAG) ×2
BAG DRN RND TRDRP ANRFLXCHMBR (UROLOGICAL SUPPLIES) ×2
BAG HAMPER (MISCELLANEOUS) ×3 IMPLANT
BAG URINE DRAIN 2000ML AR STRL (UROLOGICAL SUPPLIES) ×3 IMPLANT
CATH FOLEY 2WAY SLVR  5CC 18FR (CATHETERS) ×3
CATH FOLEY 2WAY SLVR 5CC 18FR (CATHETERS) ×2 IMPLANT
DRSG TELFA 3X8 NADH (GAUZE/BANDAGES/DRESSINGS) ×3 IMPLANT
ELECT REM PT RETURN 9FT ADLT (ELECTROSURGICAL) ×3
ELECTRODE REM PT RTRN 9FT ADLT (ELECTROSURGICAL) ×2 IMPLANT
GLOVE SURG LTX SZ6.5 (GLOVE) ×3 IMPLANT
GLOVE SURG POLYISO LF SZ8 (GLOVE) ×3 IMPLANT
GLOVE SURG UNDER POLY LF SZ7 (GLOVE) ×6 IMPLANT
GOWN STRL REUS W/TWL LRG LVL3 (GOWN DISPOSABLE) ×3 IMPLANT
GOWN STRL REUS W/TWL XL LVL3 (GOWN DISPOSABLE) ×3 IMPLANT
IV NS IRRIG 3000ML ARTHROMATIC (IV SOLUTION) ×3 IMPLANT
KIT TURNOVER CYSTO (KITS) ×3 IMPLANT
LASER FIBER DISP 1000U (UROLOGICAL SUPPLIES) IMPLANT
PACK CYSTO (CUSTOM PROCEDURE TRAY) ×3 IMPLANT
PAD ARMBOARD 7.5X6 YLW CONV (MISCELLANEOUS) ×3 IMPLANT
TRACTIP FLEXIVA PULS ID 200XHI (Laser) IMPLANT
TRACTIP FLEXIVA PULSE ID 200 (Laser)
WATER STERILE IRR 1000ML POUR (IV SOLUTION) ×3 IMPLANT

## 2021-10-12 NOTE — Anesthesia Postprocedure Evaluation (Signed)
Anesthesia Post Note  Patient: [name]  Procedure(s) Performed: CYSTOSCOPY WITH LITHOLAPAXY (Bladder) CYSTOSCOPY WITH BIOPSY AND FULGERATION (Bladder)  Patient location during evaluation: PACU Anesthesia Type: General Level of consciousness: awake and alert and oriented Pain management: pain level controlled Vital Signs Assessment: post-procedure vital signs reviewed and stable Respiratory status: spontaneous breathing, nonlabored ventilation and respiratory function stable Cardiovascular status: blood pressure returned to baseline and stable Postop Assessment: no apparent nausea or vomiting Anesthetic complications: no   No notable events documented.   Last Vitals:  Vitals:   10/12/21 1228  BP: (!) 143/75  Pulse: 70  Resp: 14  Temp: 37.1 C  SpO2: 100%    Last Pain:  Vitals:   10/12/21 1415  TempSrc:   PainSc: 5                  Trentyn Boisclair C Endora Teresi

## 2021-10-12 NOTE — Op Note (Signed)
Preoperative diagnosis: bladder calculi and bladder lesions  Postoperative diagnosis: Same  Procedure: 1 cystoscopy 2. Cystolithalopaxy for a stone less than 2.5cm 3. Bladder biopsy with fulgeration  Attending: Nicolette Bang  Anesthesia: General  Estimated blood loss: Minimal  Drains: 18 French foley  Specimens:  Bladder biopsies x 3  Antibiotics: ancef  Findings: Numerous 2-43mm calculi. Diffuse erythema involving trigone and posterior wall. Ureteral orifices in normal anatomic location.   Indications: Patient is a 65 year old female with a history of dysuria who was found to have bladder calculi and erythematous bladder lesions on office cystoscopy.  After discussing treatment options, they decided proceed with bladder biopsy and cystolithalopaxy.  Procedure in detail: The patient was brought to the operating room and a brief timeout was done to ensure correct patient, correct procedure, correct site.  General anesthesia was administered patient was placed in dorsal lithotomy position.  Their genitalia was then prepped and draped in usual sterile fashion.  A rigid 46 French cystoscope was passed in the urethra and the bladder.  Bladder was inspected and we noted diffuse erythema involving the trigone and posterior wall. We noted numerous 2-67mm bladder calculi. Using the cystoscope sheath the stones were irrigated from the bladder.  the ureteral orifices were in the normal orthotopic locations.  We then proceeded to obtain multiple biopsies from the posterior wall and trigone where there were areas of erythema. Hemostasis was then obtained with a bugbee. the bladder was then drained, a 18 French foley was placed and this concluded the procedure which was well tolerated by patient.  Complications: None  Condition: Stable, extubated, transferred to PACU  Plan: Patient will be discharged home and will followup in 5 days for voiding trial

## 2021-10-12 NOTE — Interval H&P Note (Signed)
History and Physical Interval Note:  10/12/2021 10:34 AM  [name]  has presented today for surgery, with the diagnosis of bladder calculi exposed bladder mesh.  The various methods of treatment have been discussed with the patient and family. After consideration of risks, benefits and other options for treatment, the patient has consented to  Procedure(s): CYSTOSCOPY WITH LITHOLAPAXY (N/A) HOLMIUM LASER APPLICATION (N/A) FOREIGN BODY REMOVAL ADULT- excision of bladder mesh (N/A) as a surgical intervention.  The patient's history has been reviewed, patient examined, no change in status, stable for surgery.  I have reviewed the patient's chart and labs.  Questions were answered to the patient's satisfaction.     Nicolette Bang

## 2021-10-12 NOTE — Anesthesia Procedure Notes (Signed)
Procedure Name: LMA Insertion Date/Time: 10/12/2021 1:34 PM Performed by: Karna Dupes, CRNA Pre-anesthesia Checklist: Patient identified, Emergency Drugs available, Suction available and Patient being monitored Patient Re-evaluated:Patient Re-evaluated prior to induction Oxygen Delivery Method: Circle system utilized Preoxygenation: Pre-oxygenation with 100% oxygen Induction Type: IV induction LMA: LMA inserted LMA Size: 4.0 Number of attempts: 1 Placement Confirmation: positive ETCO2 and breath sounds checked- equal and bilateral Tube secured with: Tape Dental Injury: Teeth and Oropharynx as per pre-operative assessment

## 2021-10-12 NOTE — Anesthesia Preprocedure Evaluation (Signed)
Anesthesia Evaluation  Patient identified by MRN, date of birth, ID band Patient awake    Reviewed: Allergy & Precautions, NPO status , Patient's Chart, lab work & pertinent test results  Airway Mallampati: II  TM Distance: >3 FB Neck ROM: Full    Dental  (+) Dental Advisory Given, Teeth Intact   Pulmonary neg pulmonary ROS,    Pulmonary exam normal breath sounds clear to auscultation       Cardiovascular negative cardio ROS Normal cardiovascular exam Rhythm:Regular Rate:Normal     Neuro/Psych negative neurological ROS  negative psych ROS   GI/Hepatic Neg liver ROS, GERD  Medicated and Controlled,  Endo/Other  negative endocrine ROS  Renal/GU negative Renal ROS  negative genitourinary   Musculoskeletal negative musculoskeletal ROS (+)   Abdominal   Peds negative pediatric ROS (+)  Hematology negative hematology ROS (+)   Anesthesia Other Findings Right breast cancer  Reproductive/Obstetrics negative OB ROS                            Anesthesia Physical Anesthesia Plan  ASA: 2  Anesthesia Plan: General   Post-op Pain Management:    Induction: Intravenous  PONV Risk Score and Plan: 4 or greater and Ondansetron, Dexamethasone and Midazolam  Airway Management Planned: LMA  Additional Equipment:   Intra-op Plan:   Post-operative Plan: Extubation in OR  Informed Consent: I have reviewed the patients History and Physical, chart, labs and discussed the procedure including the risks, benefits and alternatives for the proposed anesthesia with the patient or authorized representative who has indicated his/her understanding and acceptance.     Dental advisory given  Plan Discussed with: CRNA and Surgeon  Anesthesia Plan Comments:         Anesthesia Quick Evaluation

## 2021-10-12 NOTE — Transfer of Care (Signed)
Immediate Anesthesia Transfer of Care Note  Patient: [name]  Procedure(s) Performed: CYSTOSCOPY WITH LITHOLAPAXY (Bladder) CYSTOSCOPY WITH BIOPSY AND FULGERATION (Bladder)  Patient Location: PACU  Anesthesia Type:General  Level of Consciousness: awake, alert  and oriented  Airway & Oxygen Therapy: Patient Spontanous Breathing and Patient connected to nasal cannula oxygen  Post-op Assessment: Report given to RN and Post -op Vital signs reviewed and stable  Post vital signs: Reviewed and stable  Last Vitals:  Vitals Value Taken Time  BP    Temp    Pulse    Resp    SpO2 98%     Last Pain:  Vitals:   10/12/21 1228  TempSrc: Oral  PainSc: 0-No pain      Patients Stated Pain Goal: 5 (93/81/01 7510)  Complications: No notable events documented.

## 2021-10-13 ENCOUNTER — Encounter (HOSPITAL_COMMUNITY): Payer: Self-pay | Admitting: Urology

## 2021-10-13 LAB — SURGICAL PATHOLOGY

## 2021-10-19 ENCOUNTER — Encounter: Payer: Self-pay | Admitting: Urology

## 2021-10-19 ENCOUNTER — Other Ambulatory Visit: Payer: Self-pay

## 2021-10-19 ENCOUNTER — Ambulatory Visit (INDEPENDENT_AMBULATORY_CARE_PROVIDER_SITE_OTHER): Payer: Commercial Managed Care - PPO | Admitting: Urology

## 2021-10-19 VITALS — BP 136/72 | Temp 97.6°F | Wt 138.0 lb

## 2021-10-19 DIAGNOSIS — N3021 Other chronic cystitis with hematuria: Secondary | ICD-10-CM | POA: Diagnosis not present

## 2021-10-19 MED ORDER — DOXYCYCLINE HYCLATE 100 MG PO CAPS: 100.0000 mg | ORAL_CAPSULE | Freq: Two times a day (BID) | ORAL | 0 refills | Status: DC

## 2021-10-19 MED ORDER — SULFAMETHOXAZOLE-TRIMETHOPRIM 800-160 MG PO TABS: 1.0000 | ORAL_TABLET | Freq: Two times a day (BID) | ORAL | 0 refills | Status: DC

## 2021-10-19 NOTE — Patient Instructions (Signed)

## 2021-10-19 NOTE — Progress Notes (Signed)
Fill and Pull Catheter Removal  Patient is present today for a catheter removal.  Patient was cleaned and prepped in a sterile fashion 125ml of sterile water/ saline was instilled into the bladder when the patient felt the urge to urinate. 8ml of water was then drained from the balloon.  A 18FR foley cath was removed from the bladder no complications were noted .  Patient as then given some time to void on their own.  Patient can void  168ml on their own after some time.  Patient tolerated well.  Performed by: Estill Bamberg RN  Follow up/ Additional notes: patient to keep scheduled follow up office visit with MD.

## 2021-10-19 NOTE — Progress Notes (Signed)

## 2021-10-19 NOTE — Progress Notes (Signed)
10/19/2021 9:20 AM   Joycelyn Schmid 1956-10-18 [id number]  Referring provider: Health, [facility] [address] [place],  [place] [address]  Followup bladder biopsy   HPI: Ms Sculley is a 13KG here for followup after bladder biopsy. Pathology benign. She passed her voiding trial today. NO worsening pelvic pain. No other complaints today   PMH: Past Medical History:  Diagnosis Date   Breast cancer ([place]) 2010   Right Breast Cancer   Cancer [facility])    Chronic urinary tract infection    GERD (gastroesophageal reflux disease)    HSV-1 infection    HSV-2 infection 2020    Surgical History: Past Surgical History:  Procedure Laterality Date   ABDOMINAL HYSTERECTOMY  August 2010   BREAST LUMPECTOMY Right 2010   BREAST SURGERY  2010   Breast cancer   COLONOSCOPY N/A 11/07/2014   Procedure: COLONOSCOPY;  Surgeon: Rogene Houston, MD;  Location: [facility] ENDO SU[facility];  Service: Endoscopy;  Laterality: N/A;  [place] N/A 10/12/2021   Procedure: CYSTOSCOPY WITH BIOPSY AND FULGERATION;  Surgeon: Cleon Gustin, MD;  Location: [facility] ORS;  Service: Urology;  Laterality: N/A;   CYSTOSCOPY WITH LITHOLAPAXY N/A 10/12/2021   Procedure: CYSTOSCOPY WITH LITHOLAPAXY;  Surgeon: Cleon Gustin, MD;  Location: [facility] ORS;  Service: Urology;  Laterality: N/A;   ESOPHAGOGASTRODUODENOSCOPY N/A 09/18/2018   Procedure: ESOPHAGOGASTRODUODENOSCOPY (EGD);  Surgeon: Rogene Houston, MD;  Location: [facility];  Service: Endoscopy;  Laterality: N/A;  12:25    Home Medications:  Allergies as of 10/19/2021       Reactions   Pantoprazole    Patient states that it gave her headaches.   Macrobid [nitrofurantoin Macrocrystal]    Breathing problems. 02 level dropped        Medication List        Accurate as of October 19, 2021  9:20 AM. If you have any questions, ask your nurse or doctor.          amoxicillin-clavulanate 875-125 MG tablet Commonly known as:  AUGMENTIN Take 1 tablet by mouth every 12 (twelve) hours.   cefUROXime 250 MG tablet Commonly known as: CEFTIN Take 1 tablet (250 mg total) by mouth 2 (two) times daily with a meal.   ciprofloxacin 500 MG tablet Commonly known as: CIPRO Take 1 tablet (500 mg total) by mouth 2 (two) times daily.   CITRACAL CALCIUM GUMMIES PO Take 2 tablets by mouth daily.   mirabegron ER 25 MG Tb24 tablet Commonly known as: MYRBETRIQ Take 1 tablet (25 mg total) by mouth daily.   multivitamin with minerals Tabs tablet Take 1 tablet by mouth daily.   omeprazole 20 MG capsule Commonly known as: PRILOSEC Take 1 capsule (20 mg total) by mouth daily. What changed:  when to take this reasons to take this   oxyCODONE-acetaminophen 5-325 MG tablet Commonly known as: Percocet Take 1 tablet by mouth every 4 (four) hours as needed for severe pain.   AZO DINE MAXIMUM STRENGTH PO Take 2 tablets by mouth in the morning and at bedtime.   phenazopyridine 200 MG tablet Commonly known as: Pyridium Take 1 tablet (200 mg total) by mouth 3 (three) times daily as needed for pain.   Uribel 118 MG Caps Take 1 capsule (118 mg total) by mouth 2 (two) times daily as needed.   Vitamin D3 50 MCG (2000 UT) capsule Take 2,000 Units by mouth every other day.  Allergies:  Allergies  Allergen Reactions   Pantoprazole     Patient states that it gave her headaches.   Macrobid [Nitrofurantoin Macrocrystal]     Breathing problems. 02 level dropped    Family History: Family History  Problem Relation Age of Onset   Breast cancer Mother 24   Asthma Mother    Lung cancer Father    Bladder Cancer Brother    Bladder Cancer Maternal Grandfather    Lung cancer Brother     Social History:  reports that she has never smoked. She has never used smokeless tobacco. She reports that she does not drink alcohol and does not use drugs.  ROS: All other review of systems were reviewed and are negative except what  is noted above in HPI  Physical Exam: BP 136/72   Temp 97.6 F (36.4 C)   Wt 138 lb (62.6 kg)   BMI 22.27 kg/m   Constitutional:  Alert and oriented, No acute distress. HEENT: [place] AT, moist mucus membranes.  Trachea midline, no masses. Cardiovascular: No clubbing, cyanosis, or edema. Respiratory: Normal respiratory effort, no increased work of breathing. GI: Abdomen is soft, nontender, nondistended, no abdominal masses GU: No CVA tenderness.  Lymph: No cervical or inguinal lymphadenopathy. Skin: No rashes, bruises or suspicious lesions. Neurologic: Grossly intact, no focal deficits, moving all 4 extremities. Psychiatric: Normal mood and affect.  Laboratory Data: Lab Results  Component Value Date   WBC 7.8 05/13/2018   HGB 13.1 05/13/2018   HCT 40.0 05/13/2018   MCV 92.8 05/13/2018   PLT 196 05/13/2018    Lab Results  Component Value Date   CREATININE 0.60 09/14/2021    No results found for: PSA  No results found for: TESTOSTERONE  No results found for: HGBA1C  Urinalysis    Component Value Date/Time   APPEARANCEUR Cloudy (A) 09/30/2021 1537   GLUCOSEU 1+ (A) 09/30/2021 1537   BILIRUBINUR Negative 09/30/2021 1537   PROTEINUR 3+ (A) 09/30/2021 1537   NITRITE Positive (A) 09/30/2021 1537   LEUKOCYTESUR 3+ (A) 09/30/2021 1537    Lab Results  Component Value Date   LABMICR See below: 09/30/2021   WBCUA >30 (A) 09/30/2021   LABEPIT >10 (A) 09/30/2021   MUCUS Present 09/30/2021   BACTERIA Moderate (A) 09/30/2021    Pertinent Imaging:  No results found for this or any previous visit.  No results found for this or any previous visit.  No results found for this or any previous visit.  No results found for this or any previous visit.  Results for orders placed during the hospital encounter of 08/17/17  US Renal  Narrative CLINICAL DATA:  Chronic cystitis  EXAM: RENAL / URINARY TRACT ULTRASOUND COMPLETE  COMPARISON:  None.  FINDINGS: Right  Kidney:  Length: 10.7 cm. Echogenicity and renal cortical thickness are within normal limits. No mass, perinephric fluid, or hydronephrosis visualized. No sonographically demonstrable calculus or ureterectasis.  Left Kidney:  Length: 13.0 cm. Echogenicity and renal cortical thickness are within normal limits. No mass, perinephric fluid, or hydronephrosis visualized. No sonographically demonstrable calculus or ureterectasis.  Bladder:  Appears normal for degree of bladder distention. Prevoid measured volume is 351 mL. Postvoid volume measured at 71 mL.  IMPRESSION: 1. Left kidney larger than right kidney. Significance of this finding uncertain. This finding potentially could indicate renal artery stenosis on the right. In this regard, question whether patient is hypertensive.  2.  Kidneys otherwise appear normal bilaterally.  3.  Relatively mild postvoid residual in  urinary bladder.   Electronically Signed By: Lowella Grip III M.D. On: 08/17/2017 10:41  No results found for this or any previous visit.  Results for orders placed during the hospital encounter of 09/14/21  CT HEMATURIA WORKUP  Narrative CLINICAL DATA:  Hematuria, chronic urinary tract infections in a 65 year old female.  EXAM: CT ABDOMEN AND PELVIS WITHOUT AND WITH CONTRAST  TECHNIQUE: Multidetector CT imaging of the abdomen and pelvis was performed following the standard protocol before and following the bolus administration of intravenous contrast.  CONTRAST:  125mL OMNIPAQUE IOHEXOL 350 MG/ML SOLN  COMPARISON:  None  FINDINGS: Lower chest: Incidental imaging of the lung bases is unremarkable. No effusion or sign of consolidative process.  Hepatobiliary: No focal, suspicious hepatic lesion. No pericholecystic stranding. No biliary duct dilation. Portal vein is patent.  Pancreas: Normal, without mass, inflammation or ductal dilatation.  Spleen: Normal spleen.  Adrenals/Urinary  Tract: Adrenal glands are normal.  Symmetric renal enhancement. No hydronephrosis. Punctate calculus in the interpolar LEFT kidney. Dense material at the bladder base likely multiple small calculi but not well characterized. No gross lesion in the urinary bladder though bladder distension limits assessment as the bladder is under distended on the current study. No suspicious renal lesion. No upper tract lesion on excretory images.  Stomach/Bowel: No acute gastrointestinal process. Appendix is normal.  Vascular/Lymphatic:  Aortic atherosclerosis. No sign of aneurysm. Smooth contour of the IVC. There is no gastrohepatic or hepatoduodenal ligament lymphadenopathy. No retroperitoneal or mesenteric lymphadenopathy.  No pelvic sidewall lymphadenopathy.  Reproductive: Post hysterectomy.  No adnexal masses.  Other: No ascites.  Musculoskeletal: No acute or significant osseous findings.  IMPRESSION: Dense material at the bladder base likely multiple small calculi but not well characterized. No gross lesion in the urinary bladder though bladder distension limits assessment as the bladder is under distended on the current study. Consider cystoscopy not recently performed to exclude plaque-like lesion with calcification.  Punctate LEFT nephrolithiasis. No ureteral calculus. No upper tract lesion or sign of stricture.  Aortic Atherosclerosis (ICD10-I70.0).   Electronically Signed By: Zetta Bills M.D. On: 09/14/2021 13:06  No results found for this or any previous visit.   Assessment & Plan:    1. Chronic cystitis with hematuria -doxycyline 100mg  BID for 28 days then bactrim DS daily for 30 days. RTC 3 months   No follow-ups on file.  Nicolette Bang, MD  [facility] [place]

## 2021-10-21 ENCOUNTER — Telehealth: Payer: Self-pay

## 2021-10-21 NOTE — Telephone Encounter (Signed)
FMLA paper work and release for work paper work submitted via fax to [phone]

## 2021-10-28 ENCOUNTER — Other Ambulatory Visit: Payer: Self-pay

## 2021-10-28 ENCOUNTER — Other Ambulatory Visit: Payer: Commercial Managed Care - PPO

## 2021-10-28 DIAGNOSIS — N3000 Acute cystitis without hematuria: Secondary | ICD-10-CM

## 2021-10-28 LAB — MICROSCOPIC EXAMINATION: Renal Epithel, UA: NONE SEEN /hpf

## 2021-10-28 LAB — URINALYSIS, ROUTINE W REFLEX MICROSCOPIC
Bilirubin, UA: NEGATIVE
Glucose, UA: NEGATIVE
Ketones, UA: NEGATIVE
Nitrite, UA: NEGATIVE
Protein,UA: NEGATIVE
Specific Gravity, UA: 1.015 (ref 1.005–1.030)
Urobilinogen, Ur: 0.2 mg/dL (ref 0.2–1.0)
pH, UA: 7 (ref 5.0–7.5)

## 2021-10-28 MED ORDER — AMOXICILLIN-POT CLAVULANATE 875-125 MG PO TABS: 1.0000 | ORAL_TABLET | Freq: Two times a day (BID) | ORAL | 0 refills | Status: DC

## 2021-10-31 LAB — URINE CULTURE

## 2021-11-02 ENCOUNTER — Telehealth: Payer: Self-pay

## 2021-11-02 NOTE — Telephone Encounter (Signed)
Pt called back again she wants to know if she would like another antibiotic , she said that she isn't getting any better.she is aware that culture is neg.  Please advise

## 2021-11-02 NOTE — Telephone Encounter (Signed)
Pt called and wants to know the results of her culture , she is on augmentin now and is helping , she wants to know if she needs if she needs to be on on another antibiotic, please advise

## 2021-11-10 ENCOUNTER — Telehealth: Payer: Self-pay

## 2021-11-10 DIAGNOSIS — R350 Frequency of micturition: Secondary | ICD-10-CM

## 2021-11-10 MED ORDER — MIRABEGRON ER 25 MG PO TB24: 25.0000 mg | ORAL_TABLET | Freq: Every day | ORAL | 0 refills | Status: DC

## 2021-11-10 NOTE — Telephone Encounter (Signed)
Received call from patient complaining of frequency and nocturia.  Per Dr. Alyson Ingles ok to give myrbetriq 25mg  po qd Patient will come by office for samples.

## 2021-11-22 ENCOUNTER — Other Ambulatory Visit: Payer: Self-pay | Admitting: Urology

## 2021-11-30 NOTE — Telephone Encounter (Signed)
Pt was made aware.  

## 2021-12-02 ENCOUNTER — Ambulatory Visit: Payer: Commercial Managed Care - PPO | Admitting: Urology

## 2021-12-11 ENCOUNTER — Other Ambulatory Visit: Payer: Self-pay

## 2021-12-11 ENCOUNTER — Other Ambulatory Visit: Payer: Commercial Managed Care - PPO

## 2021-12-11 DIAGNOSIS — R350 Frequency of micturition: Secondary | ICD-10-CM

## 2021-12-11 LAB — URINALYSIS, ROUTINE W REFLEX MICROSCOPIC
Bilirubin, UA: NEGATIVE
Glucose, UA: NEGATIVE
Ketones, UA: NEGATIVE
Nitrite, UA: POSITIVE — AB
Protein,UA: NEGATIVE
RBC, UA: NEGATIVE
Specific Gravity, UA: 1.025 (ref 1.005–1.030)
Urobilinogen, Ur: 0.2 mg/dL (ref 0.2–1.0)
pH, UA: 7 (ref 5.0–7.5)

## 2021-12-11 LAB — MICROSCOPIC EXAMINATION
RBC, Urine: NONE SEEN /hpf (ref 0–2)
Renal Epithel, UA: NONE SEEN /hpf

## 2021-12-11 MED ORDER — AMOXICILLIN-POT CLAVULANATE 875-125 MG PO TABS: 1.0000 | ORAL_TABLET | Freq: Two times a day (BID) | ORAL | 0 refills | Status: DC

## 2021-12-11 NOTE — Progress Notes (Signed)
Urine indicates UTI. Augmentin x 7 days sent to pharm. Culture pending

## 2021-12-14 ENCOUNTER — Telehealth: Payer: Self-pay

## 2021-12-15 ENCOUNTER — Other Ambulatory Visit: Payer: Self-pay

## 2021-12-15 ENCOUNTER — Other Ambulatory Visit: Payer: Self-pay | Admitting: Physician Assistant

## 2021-12-15 LAB — URINE CULTURE

## 2021-12-15 MED ORDER — DOXYCYCLINE MONOHYDRATE 100 MG PO TABS: 100.0000 mg | ORAL_TABLET | Freq: Two times a day (BID) | ORAL | 0 refills | Status: DC

## 2021-12-15 NOTE — Progress Notes (Signed)
Urine cx indicates multi-resistant E. Coli. Pt on Augmentin. Will DC Augmentin and start Doxy 100 BID X 7 days. Cysto appt on 23rd.

## 2021-12-15 NOTE — Telephone Encounter (Signed)
Pt was called and a vm was left asking her to call us back regarding her urine culture.

## 2021-12-15 NOTE — Telephone Encounter (Signed)
-----   Message from [place], [place] sent at 12/15/2021  8:43 AM EST ----- Please let pt know that the urine cx indicates that her UTI is resistant to Augmentin. She needs to start new Rx for Doxycycline. I sent to her pharm ----- Message ----- From: Interface, [facility] Lab Results In Sent: 12/11/2021   3:45 PM EST To: Reynaldo Minium, PA-C

## 2021-12-15 NOTE — Telephone Encounter (Signed)
Pt returned phone call, she was informed to pick up new prescription for UTI. Pt was understanding

## 2021-12-18 ENCOUNTER — Other Ambulatory Visit: Payer: Commercial Managed Care - PPO | Admitting: Urology

## 2021-12-22 ENCOUNTER — Other Ambulatory Visit: Payer: Self-pay

## 2021-12-22 ENCOUNTER — Other Ambulatory Visit: Payer: Commercial Managed Care - PPO

## 2021-12-22 ENCOUNTER — Telehealth: Payer: Self-pay

## 2021-12-22 ENCOUNTER — Other Ambulatory Visit: Payer: Self-pay | Admitting: Physician Assistant

## 2021-12-22 DIAGNOSIS — R350 Frequency of micturition: Secondary | ICD-10-CM

## 2021-12-22 LAB — URINALYSIS, ROUTINE W REFLEX MICROSCOPIC
Bilirubin, UA: NEGATIVE
Glucose, UA: NEGATIVE
Nitrite, UA: NEGATIVE
Protein,UA: NEGATIVE
Specific Gravity, UA: 1.025 (ref 1.005–1.030)
Urobilinogen, Ur: 0.2 mg/dL (ref 0.2–1.0)
pH, UA: 5.5 (ref 5.0–7.5)

## 2021-12-22 LAB — MICROSCOPIC EXAMINATION: Renal Epithel, UA: NONE SEEN /hpf

## 2021-12-23 ENCOUNTER — Other Ambulatory Visit: Payer: Self-pay | Admitting: Physician Assistant

## 2021-12-23 DIAGNOSIS — N3021 Other chronic cystitis with hematuria: Secondary | ICD-10-CM

## 2021-12-23 DIAGNOSIS — N390 Urinary tract infection, site not specified: Secondary | ICD-10-CM

## 2021-12-23 MED ORDER — FOSFOMYCIN TROMETHAMINE 3 G PO PACK: 3.0000 g | PACK | ORAL | 0 refills | Status: AC

## 2021-12-23 NOTE — Telephone Encounter (Signed)
-----   Message from [place], [place] sent at 12/22/2021  3:39 PM EST ----- Pt should be on Doxy which was Rxd on 12/20. Current UA concerning for persistent UTI. Culture pending. Please make sure pt afebrile without nausea/vomiting. She should have one dose of Doxy left. Will discuss tx options with Dr. Alyson Ingles in the a.m. and call her to go over the plan.

## 2021-12-23 NOTE — Progress Notes (Signed)
Discussed the patient's recent urinalysis and culture studies as well as treatment options.  She is made aware that her only options at this point are gentamicin IM, IV antibiotics via PICC line, or a course of fosfomycin.  She would like to try the fosfomycin as she has tolerated this in the past.  Will alter treatment pending yesterday's culture result.  Also discussed the option of an infectious disease consult for further treatment recommendations.  Patient is agreeable to this and ambulatory consult order placed.  She will keep her current appointment for follow-up with Dr. Alyson Ingles as discussed.

## 2021-12-23 NOTE — Telephone Encounter (Signed)
PA spoke with patient earlier concerning this patient. Please see telephone encounter.

## 2021-12-24 ENCOUNTER — Telehealth: Payer: Self-pay

## 2021-12-24 ENCOUNTER — Telehealth: Payer: Self-pay | Admitting: Physician Assistant

## 2021-12-24 LAB — URINE CULTURE

## 2021-12-24 NOTE — Telephone Encounter (Signed)
Pt called with questions concerning ID consult stating that she does not have an infection disease. Explained that ID specializes not only in diseases, but also in resistant infections. Pt agrees to schedule appt as recommended and all questions answered

## 2021-12-24 NOTE — Telephone Encounter (Signed)
-----   Message from Reynaldo Minium, [place] sent at 12/24/2021  2:57 PM EST ----- Doristine Devoid news! Culture indicates no need to change therapy. Pt needs to take final dose of antibx and continue to increase her fluid intake ----- Message ----- From: Mardelle Matte, CMA Sent: 12/24/2021   2:49 PM EST To: Berneice Heinrich [place], PA-C  Please review

## 2021-12-24 NOTE — Telephone Encounter (Signed)
Patient called and notified of results.  Voiced understanding. 

## 2021-12-31 ENCOUNTER — Telehealth: Payer: Self-pay

## 2021-12-31 ENCOUNTER — Other Ambulatory Visit: Payer: Commercial Managed Care - PPO

## 2021-12-31 ENCOUNTER — Other Ambulatory Visit: Payer: Self-pay

## 2021-12-31 DIAGNOSIS — N3021 Other chronic cystitis with hematuria: Secondary | ICD-10-CM

## 2021-12-31 LAB — URINALYSIS, ROUTINE W REFLEX MICROSCOPIC
Bilirubin, UA: NEGATIVE
Glucose, UA: NEGATIVE
Ketones, UA: NEGATIVE
Nitrite, UA: NEGATIVE
Specific Gravity, UA: 1.02 (ref 1.005–1.030)
Urobilinogen, Ur: 0.2 mg/dL (ref 0.2–1.0)
pH, UA: 7.5 (ref 5.0–7.5)

## 2021-12-31 NOTE — Telephone Encounter (Signed)
Patient called with no answer. Message left informing patient that PA will wait for culture to come back to treat.

## 2022-01-02 LAB — URINE CULTURE

## 2022-01-04 ENCOUNTER — Other Ambulatory Visit: Payer: Self-pay

## 2022-01-04 ENCOUNTER — Ambulatory Visit (INDEPENDENT_AMBULATORY_CARE_PROVIDER_SITE_OTHER): Payer: Commercial Managed Care - PPO | Admitting: Internal Medicine

## 2022-01-04 ENCOUNTER — Telehealth: Payer: Self-pay

## 2022-01-04 ENCOUNTER — Encounter: Payer: Self-pay | Admitting: Internal Medicine

## 2022-01-04 VITALS — BP 141/84 | HR 89 | Temp 98.2°F | Wt 146.0 lb

## 2022-01-04 DIAGNOSIS — R8271 Bacteriuria: Secondary | ICD-10-CM | POA: Diagnosis not present

## 2022-01-04 DIAGNOSIS — N3020 Other chronic cystitis without hematuria: Secondary | ICD-10-CM

## 2022-01-04 NOTE — Telephone Encounter (Signed)
Pt called asking for medication for bladder inflammation.

## 2022-01-04 NOTE — Progress Notes (Signed)
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 female referred from urology for UTI  Has tried nitrofurantoin in the past for prevention but has pneumonitis  She has constant frequency, dysuria, lower abd spasm. She has taken several abx courses but sx usually doesn't go away completely, and sometimes if go away mostly then get back in intensity within a few days  No fever, chill  She has urology follow up   S/p complete hysterectomy with bladder mesh  Repeat urine cx past 4 years mostly pseudomonas (R cipro), and one time recently with esbl ecoli.  Cystoscopy 09/2021 and biopsy --> stones --> removed unclear if all stones. Pathology showed follicular cystitis.  No hx smoking  Grandfather has bladder cancer; he was non-smoker  Last time she received abx a week prior to this admission 2 days fosfomycin for ecoli esbl in urine. For her pseudomonas she had had appropriate abx including IV gentamycin but no symptoms  Even without abx sx wax and wanes and improves with tylenol/nsaids   No anatomical abnormality otherwise from urology   Review of Systems: ROS All other ros negative      Past Medical History:  Diagnosis Date   Breast cancer ([place]) 2010   Right Breast Cancer   Cancer ([place])    Chronic urinary tract infection    GERD (gastroesophageal reflux disease)    HSV-1 infection    HSV-2 infection 2020    Social History   Tobacco Use   Smoking status: Never   Smokeless tobacco: Never  Vaping Use   Vaping Use: Never used  Substance Use Topics    Alcohol use: No   Drug use: No    Family History  Problem Relation Age of Onset   Breast cancer Mother 73   Asthma Mother    Lung cancer Father    Bladder Cancer Brother    Bladder Cancer Maternal Grandfather    Lung cancer Brother     Allergies  Allergen Reactions   Pantoprazole     Patient states that it gave her headaches.   Macrobid [Nitrofurantoin Macrocrystal]     Breathing problems. 02 level dropped    OBJECTIVE: Vitals:   01/04/22 1440  BP: (!) 141/84  Pulse: 89  Temp: 98.2 F (36.8 C)  TempSrc: Temporal  SpO2: 97%  Weight: 146 lb (66.2 kg)   Body mass index is 23.57 kg/m.   Physical Exam General/constitutional: no distress, pleasant HEENT: Normocephalic, PER, Conj Clear, EOMI, Oropharynx clear Neck supple CV: rrr no mrg Lungs: clear to auscultation, normal respiratory effort Abd: Soft, Nontender Ext: no edema Skin: No Rash Neuro: nonfocal MSK: no peripheral joint swelling/tenderness/warmth; back spines nontender Psych: alert/oriented    Lab: Lab Results  Component Value Date   WBC 7.8 05/13/2018   HGB 13.1 05/13/2018   HCT 40.0 05/13/2018   MCV 92.8 05/13/2018   PLT 196 05/13/2018    Microbiology:  Serology:  Imaging:   Assessment/plan: Problem List Items Addressed This Visit   None Visit Diagnoses     Bacteriuria    -  Primary  Chronic cystitis           Reviewed symptomatology history with patient. This doesn't sound like uti, but she hasn't responded and there is no chronic infectious cystitis. She had stones and colonization of pseudomonas in the past, and in settign frequent antibiotics colonized now with esbl enterobactereciae. Also hx of drug pneumonitis. So far no history of C diff colitis  She has on pathology follicular cystitis which could cause symptoms similar to uti but this is a noninfectious chronic inflammatory cystitis condition. These are managed with supportive care. Patients are frequently colonized  with bacteria but abx use without evidence of infectious cystitis are of no avail   Would advise her to discuss with gynecology and urology about sx care for this noninfectious chronic condition  If she has sepsis or acute persistent/progressive s/s of uti, advise to come to ID clinic for evaluation and treatment  Further abx at this time will only have adverse consequences         Follow-up: Return if symptoms worsen or fail to improve.  Jabier Mutton, [place] for [place] [phone] pager   [phone] cell 01/04/2022, 2:54 PM

## 2022-01-04 NOTE — Patient Instructions (Signed)
You have, based on your symptoms and pathology, follicular cystitis which could cause symptoms similar to uti but this is a noninfectious chronic inflammatory cystitis condition. These are managed with supportive care. Patients are frequently colonized with bacteria but abx use without evidence of infectious cystitis are of no avail   Would advise her to discuss with gynecology and urology about sx care for this noninfectious chronic condition  If she has sepsis or acute persistent/progressive symptoms of uti, advise to come to ID clinic for evaluation and treatment  Further antibiotics at this time will only have adverse consequences    Follow up as needed

## 2022-01-05 MED ORDER — DIAZEPAM 5 MG PO TABS: 5.0000 mg | ORAL_TABLET | Freq: Two times a day (BID) | ORAL | 0 refills | Status: DC | PRN

## 2022-01-05 NOTE — Telephone Encounter (Signed)
Pt was called and notified that her rx was sent in. Pt was understanding

## 2022-01-05 NOTE — Telephone Encounter (Signed)
Rx sent 

## 2022-01-06 ENCOUNTER — Telehealth: Payer: Self-pay

## 2022-01-06 NOTE — Telephone Encounter (Signed)
Pt called and was concerned that the valium she was put on is not working and she is still using the bathroom as much as before.Marland KitchenMarland KitchenMarland Kitchen

## 2022-01-13 ENCOUNTER — Other Ambulatory Visit: Payer: Self-pay | Admitting: Oncology

## 2022-01-13 DIAGNOSIS — Z1231 Encounter for screening mammogram for malignant neoplasm of breast: Secondary | ICD-10-CM

## 2022-01-27 ENCOUNTER — Other Ambulatory Visit: Payer: Self-pay

## 2022-01-27 ENCOUNTER — Encounter: Payer: Self-pay | Admitting: Urology

## 2022-01-27 ENCOUNTER — Ambulatory Visit (INDEPENDENT_AMBULATORY_CARE_PROVIDER_SITE_OTHER): Payer: Commercial Managed Care - PPO | Admitting: Urology

## 2022-01-27 VITALS — BP 128/68 | HR 101

## 2022-01-27 DIAGNOSIS — N390 Urinary tract infection, site not specified: Secondary | ICD-10-CM

## 2022-01-27 DIAGNOSIS — N3021 Other chronic cystitis with hematuria: Secondary | ICD-10-CM

## 2022-01-27 MED ORDER — CIPROFLOXACIN HCL 500 MG PO TABS
500.0000 mg | ORAL_TABLET | Freq: Once | ORAL | Status: AC
  Administered 2022-01-27: 500 mg via ORAL

## 2022-01-27 MED ORDER — AMITRIPTYLINE HCL 25 MG PO TABS: 25.0000 mg | ORAL_TABLET | Freq: Every day | ORAL | 11 refills | Status: DC

## 2022-01-27 MED ORDER — ELMIRON 100 MG PO CAPS: 200.0000 mg | ORAL_CAPSULE | Freq: Two times a day (BID) | ORAL | 11 refills | Status: DC

## 2022-01-27 MED ORDER — HYDROXYZINE HCL 10 MG PO TABS: 10.0000 mg | ORAL_TABLET | Freq: Every day | ORAL | 11 refills | Status: DC

## 2022-01-27 NOTE — Progress Notes (Signed)
Urological Symptom Review  Patient is experiencing the following symptoms: Frequent urination Burning/pain with urination Get up at night to urinate Stream starts and stops Trouble starting stream Weak stream   Review of Systems  Gastrointestinal (upper)  : Negative for upper GI symptoms  Gastrointestinal (lower) : Negative for lower GI symptoms  Constitutional : Negative for symptoms  Skin: Negative for skin symptoms  Eyes: Negative for eye symptoms  Ear/Nose/Throat : Negative for Ear/Nose/Throat symptoms  Hematologic/Lymphatic: Negative for Hematologic/Lymphatic symptoms  Cardiovascular : Negative for cardiovascular symptoms  Respiratory : Negative for respiratory symptoms  Endocrine: Negative for endocrine symptoms  Musculoskeletal: Negative for musculoskeletal symptoms  Neurological: Negative for neurological symptoms  Psychologic: Negative for psychiatric symptoms

## 2022-01-27 NOTE — Patient Instructions (Signed)
Interstitial Cystitis °Interstitial cystitis is inflammation of the bladder. This condition is also known as painful bladder syndrome. This may cause pain in the bladder area as well as a frequent and urgent need to urinate. The bladder is an organ that stores urine after the urine is made in the kidneys. °The severity of interstitial cystitis can vary from person to person. You may have flare-ups, and then your symptoms may go away for a while. For many people, it becomes a long-term (chronic) problem. °What are the causes? °The cause of this condition is not known. °What increases the risk? °The following factors may make you more likely to develop this condition: °Being female. °Having fibromyalgia. °Having irritable bowel syndrome (IBS). °Having endometriosis. °Having chronic fatigue syndrome. °This condition may be aggravated by: °Stress. °Smoking. °Spicy foods. °What are the signs or symptoms? °Symptoms of interstitial cystitis vary, and they can change over time. Symptoms may include: °Discomfort or pain in the bladder area, which is in the lower abdomen. Pain can range from mild to severe. The pain may change in intensity as the bladder fills with urine or as it empties. °Pain in the pelvic area, between the hip bones. °A constant urge to urinate. °Frequent urination. °Pain during urination. °Pain during sex. °Blood in the urine. °Feeling tired (fatigue). °For women, symptoms often get worse during menstruation. °How is this diagnosed? °This condition is diagnosed based on your symptoms, your medical history, and a physical exam. Your health care provider may need to rule out other conditions and may order other tests, such as: °Urine tests. °Cystoscopy. For this test, a tool similar to a very thin telescope is used to look into your bladder. °Biopsy. This involves taking a sample of tissue from the bladder to be examined under a microscope. °How is this treated? °There is no cure for this condition, but  treatment can help you control your symptoms. Work closely with your health care provider to find the most effective treatments for you. Treatment options may include: °Medicines to relieve pain and reduce how often you feel the need to urinate. This treatment may include: °A procedure where a small amount of medicine that eases irritation is put inside your bladder through a catheter (bladder instillation). °Lifestyle changes, such as changing your diet or taking steps to control stress. °Physical therapy. This may include: °Exercises to help relax the pelvic floor muscles. °Massage to relax tight muscles (myofascial release). °Learning ways to control when you urinate (bladder training). °Using a device that provides electrical stimulation to your nerves, which can relieve pain (neuromodulation therapy). The device is placed on your back, where it blocks the nerves that cause you to feel pain in your bladder area. °A procedure that stretches your bladder by filling it with air or fluid (hydrodistention). °Surgery. This is rare. It is only done for extreme cases, if other treatments do not help. °Follow these instructions at home: °Lifestyle °Learn and practice relaxation techniques, such as deep breathing and muscle relaxation. °Get care for your body and mental well-being, such as: °Cognitive behavioral therapy (CBT). This therapy changes the way you think or act in response to different situations. This may improve how you feel. °Seeing a mental health therapist to evaluate and treat depression, if necessary. °Work with your health care provider on other ways to manage pain. Acupuncture may be helpful. °Avoid drinking alcohol. °Do not use any products that contain nicotine or tobacco. These products include cigarettes, chewing tobacco, and vaping devices, such as   e-cigarettes. If you need help quitting, ask your health care provider. °Eating and drinking °Make dietary changes as recommended by your health care  provider. You may need to avoid: °Spicy foods. °Foods that contain a lot of potassium. °Limit your intake of drinks that increase your urge to urinate. These include alcohol and caffeinated drinks like soda, coffee, and tea. °Bladder training ° °Use bladder training techniques as directed. Techniques may include: °Urinating at scheduled times. °Training yourself to delay urination. °Keep a bladder diary. °Write down the times you urinate and any symptoms that you have. This can help you find out which foods, liquids, or activities make your symptoms worse. °Use your bladder diary to schedule bathroom trips. If you are away from home, plan to be near a bathroom at each of your scheduled times. °Make sure that you urinate just before you leave the house and just before you go to bed. °General instructions °Take over-the-counter and prescription medicines only as told by your health care provider. °Try a warm or cool compress over your bladder for comfort. °Avoid wearing tight clothing. °Do exercises to relax your pelvic floor muscles as told by your physical therapist. °Keep all follow-up visits. This is important. °Where to find more information °To find more information or a support group near you, visit: °[employer]: urologyhealth.org °Interstitial Cystitis Association: ichelp.org °Contact a health care provider if you have: °Symptoms that do not get better with treatment. °Pain or discomfort that gets worse. °More frequent urges to urinate. °A fever. °Get help right away if: °You have no control over when you urinate. °Summary °Interstitial cystitis is inflammation of the bladder. °This condition may cause pain in the bladder area as well as a frequent and urgent need to urinate. °You may have flare-ups of the condition, and then it may go away for a while. For many people, it becomes a long-term (chronic) problem. °There is no cure for interstitial cystitis, but treatment methods are available to  control your symptoms. °This information is not intended to replace advice given to you by your health care provider. Make sure you discuss any questions you have with your health care provider. °Document Revised: 07/18/2020 Document Reviewed: 07/18/2020 °Elsevier Patient Education © 2022 Elsevier Inc. ° °

## 2022-01-27 NOTE — Progress Notes (Signed)
° °  01/27/22  CC: followup benign bladder lesion   HPI: [name] is a 89GH here for followup for a benign bladder lesion Blood pressure 128/68, pulse (!) 101. NED. A&Ox3.   No respiratory distress   Abd soft, NT, ND Normal external genitalia with patent urethral meatus  Cystoscopy Procedure Note  Patient identification was confirmed, informed consent was obtained, and patient was prepped using Betadine solution.  Lidocaine jelly was administered per urethral meatus.    Procedure: - Flexible cystoscope introduced, without any difficulty.   - Thorough search of the bladder revealed:    normal urethral meatus    normal urothelium    no stones    no ulcers     no tumors    no urethral polyps    no trabeculation  - Ureteral orifices were normal in position and appearance.  Post-Procedure: - Patient tolerated the procedure well  Assessment/ Plan: -We will start elmiron 200mg  BID, atarax 10mg  qhs and elavil 25mg  daily., RTC 3 months   No follow-ups on file.  Nicolette Bang, MD

## 2022-02-18 ENCOUNTER — Other Ambulatory Visit: Payer: Self-pay | Admitting: Urology

## 2022-04-28 ENCOUNTER — Ambulatory Visit: Payer: Commercial Managed Care - PPO | Admitting: Physician Assistant

## 2022-04-30 ENCOUNTER — Ambulatory Visit: Payer: Commercial Managed Care - PPO | Admitting: Physician Assistant

## 2022-08-06 ENCOUNTER — Ambulatory Visit: Payer: Commercial Managed Care - PPO

## 2022-08-16 ENCOUNTER — Ambulatory Visit: Payer: Commercial Managed Care - PPO

## 2022-08-20 ENCOUNTER — Ambulatory Visit
Admission: RE | Admit: 2022-08-20 | Discharge: 2022-08-20 | Disposition: A | Discharge status: Home / Self Care | Payer: Commercial Managed Care - PPO | Source: Ambulatory Visit | Attending: Oncology | Admitting: Oncology

## 2022-08-20 DIAGNOSIS — Z1231 Encounter for screening mammogram for malignant neoplasm of breast: Secondary | ICD-10-CM

## 2022-11-06 ENCOUNTER — Encounter (INDEPENDENT_AMBULATORY_CARE_PROVIDER_SITE_OTHER): Payer: Self-pay | Admitting: Gastroenterology

## 2022-11-26 ENCOUNTER — Ambulatory Visit: Payer: Commercial Managed Care - PPO | Admitting: Urology

## 2022-12-03 ENCOUNTER — Ambulatory Visit: Payer: Commercial Managed Care - PPO | Admitting: Urology

## 2022-12-29 ENCOUNTER — Ambulatory Visit (INDEPENDENT_AMBULATORY_CARE_PROVIDER_SITE_OTHER): Payer: Commercial Managed Care - PPO | Admitting: Urology

## 2022-12-29 VITALS — BP 138/82 | HR 106

## 2022-12-29 DIAGNOSIS — N3021 Other chronic cystitis with hematuria: Secondary | ICD-10-CM | POA: Diagnosis not present

## 2022-12-29 DIAGNOSIS — N3010 Interstitial cystitis (chronic) without hematuria: Secondary | ICD-10-CM

## 2022-12-29 MED ORDER — AMITRIPTYLINE HCL 25 MG PO TABS: 25.0000 mg | ORAL_TABLET | Freq: Every day | ORAL | 4 refills | Status: DC

## 2022-12-29 MED ORDER — ELMIRON 100 MG PO CAPS
200.0000 mg | ORAL_CAPSULE | Freq: Two times a day (BID) | ORAL | 11 refills | Status: DC
Start: 2022-12-29 — End: 2024-04-20

## 2022-12-29 MED ORDER — HYDROXYZINE HCL 10 MG PO TABS: 10.0000 mg | ORAL_TABLET | Freq: Every day | ORAL | 3 refills | Status: DC

## 2022-12-29 NOTE — Progress Notes (Signed)
12/29/2022 2:21 PM   [name] 01-29-1956 [id number]  Referring provider: Health, [facility] [address] [place],  [place] [address]  Followup chronic cystitis and IC   HPI: [name] is a 67yo here for followup for IC and chronic cystitis. No UTIs since last visit. She denies nay IC flares. She remains on elmiron,atarax and elavil. NO significant LUTS. No pelvic pain. She is following the IC diet.    PMH: Past Medical History:  Diagnosis Date   Breast cancer ([place]) 2010   Right Breast Cancer   Cancer [facility])    Chronic urinary tract infection    GERD (gastroesophageal reflux disease)    HSV-1 infection    HSV-2 infection 2020    Surgical History: Past Surgical History:  Procedure Laterality Date   ABDOMINAL HYSTERECTOMY  August 2010   BREAST LUMPECTOMY Right 2010   BREAST SURGERY  2010   Breast cancer   COLONOSCOPY N/A 11/07/2014   Procedure: COLONOSCOPY;  Surgeon: Rogene Houston, MD;  [facility];  Service: Endoscopy;  Laterality: N/A;  [place] N/A 10/12/2021   Procedure: CYSTOSCOPY WITH BIOPSY AND FULGERATION;  Surgeon: Cleon Gustin, MD;  Location: [facility] ORS;  Service: Urology;  Laterality: N/A;   CYSTOSCOPY WITH LITHOLAPAXY N/A 10/12/2021   Procedure: CYSTOSCOPY WITH LITHOLAPAXY;  Surgeon: Cleon Gustin, MD;  Location: [facility] ORS;  Service: Urology;  Laterality: N/A;   ESOPHAGOGASTRODUODENOSCOPY N/A 09/18/2018   Procedure: ESOPHAGOGASTRODUODENOSCOPY (EGD);  Surgeon: Rogene Houston, MD;  Location: [facility];  Service: Endoscopy;  Laterality: N/A;  12:25    Home Medications:  Allergies as of 12/29/2022       Reactions   Pantoprazole    Patient states that it gave her headaches.   Macrobid [nitrofurantoin Macrocrystal]    Breathing problems. 02 level dropped        Medication List        Accurate as of December 29, 2022  2:21 PM. If you have any questions, ask your nurse or doctor.           amitriptyline 25 MG tablet Commonly known as: ELAVIL TAKE 1 TABLET BY MOUTH EVERYDAY AT BEDTIME   CITRACAL CALCIUM GUMMIES PO Take 2 tablets by mouth daily.   diazepam 5 MG tablet Commonly known as: Valium Take 1 tablet (5 mg total) by mouth every 12 (twelve) hours as needed for muscle spasms (bladder spoasms).   doxycycline 100 MG tablet Commonly known as: ADOXA Take 1 tablet (100 mg total) by mouth 2 (two) times daily.   Elmiron 100 MG capsule Generic drug: pentosan polysulfate Take 2 capsules (200 mg total) by mouth 2 (two) times daily.   hydrOXYzine 10 MG tablet Commonly known as: ATARAX TAKE 1 TABLET BY MOUTH EVERYDAY AT BEDTIME   mirabegron ER 25 MG Tb24 tablet Commonly known as: MYRBETRIQ Take 1 tablet (25 mg total) by mouth daily.   multivitamin with minerals Tabs tablet Take 1 tablet by mouth daily.   OMEGA-3 FISH OIL PO Take by mouth.   omeprazole 20 MG capsule Commonly known as: PRILOSEC Take 1 capsule (20 mg total) by mouth daily.   AZO DINE MAXIMUM STRENGTH PO Take 2 tablets by mouth in the morning and at bedtime.   phenazopyridine 200 MG tablet Commonly known as: Pyridium Take 1 tablet (200 mg total) by mouth 3 (three) times daily as needed for pain.   Uribel 118 MG Caps Take 1 capsule (  118 mg total) by mouth 2 (two) times daily as needed.   Vitamin D3 50 MCG (2000 UT) capsule Take 2,000 Units by mouth every other day.        Allergies:  Allergies  Allergen Reactions   Pantoprazole     Patient states that it gave her headaches.   Macrobid [Nitrofurantoin Macrocrystal]     Breathing problems. 02 level dropped    Family History: Family History  Problem Relation Age of Onset   Breast cancer Mother 62   Asthma Mother    Lung cancer Father    Bladder Cancer Brother    Bladder Cancer Maternal Grandfather    Lung cancer Brother     Social History:  reports that she has never smoked. She has never used smokeless tobacco. She reports  that she does not drink alcohol and does not use drugs.  ROS: All other review of systems were reviewed and are negative except what is noted above in HPI  Physical Exam: BP 138/82   Pulse (!) 106   Constitutional:  Alert and oriented, No acute distress. HEENT: [place] AT, moist mucus membranes.  Trachea midline, no masses. Cardiovascular: No clubbing, cyanosis, or edema. Respiratory: Normal respiratory effort, no increased work of breathing. GI: Abdomen is soft, nontender, nondistended, no abdominal masses GU: No CVA tenderness.  Lymph: No cervical or inguinal lymphadenopathy. Skin: No rashes, bruises or suspicious lesions. Neurologic: Grossly intact, no focal deficits, moving all 4 extremities. Psychiatric: Normal mood and affect.  Laboratory Data: Lab Results  Component Value Date   WBC 7.8 05/13/2018   HGB 13.1 05/13/2018   HCT 40.0 05/13/2018   MCV 92.8 05/13/2018   PLT 196 05/13/2018    Lab Results  Component Value Date   CREATININE 0.60 09/14/2021    No results found for: "PSA"  No results found for: "TESTOSTERONE"  No results found for: "HGBA1C"  Urinalysis    Component Value Date/Time   APPEARANCEUR Hazy (A) 12/31/2021 1513   GLUCOSEU Negative 12/31/2021 1513   BILIRUBINUR Negative 12/31/2021 1513   PROTEINUR Trace (A) 12/31/2021 1513   NITRITE Negative 12/31/2021 1513   LEUKOCYTESUR 2+ (A) 12/31/2021 1513    Lab Results  Component Value Date   LABMICR Comment 12/31/2021   WBCUA 11-30 (A) 12/22/2021   LABEPIT 0-10 12/22/2021   MUCUS Present 09/30/2021   BACTERIA Moderate (A) 12/22/2021    Pertinent Imaging:  No results found for this or any previous visit.  No results found for this or any previous visit.  No results found for this or any previous visit.  No results found for this or any previous visit.  Results for orders placed during the hospital encounter of 08/17/17  US Renal  Narrative CLINICAL DATA:  Chronic  cystitis  EXAM: RENAL / URINARY TRACT ULTRASOUND COMPLETE  COMPARISON:  None.  FINDINGS: Right Kidney:  Length: 10.7 cm. Echogenicity and renal cortical thickness are within normal limits. No mass, perinephric fluid, or hydronephrosis visualized. No sonographically demonstrable calculus or ureterectasis.  Left Kidney:  Length: 13.0 cm. Echogenicity and renal cortical thickness are within normal limits. No mass, perinephric fluid, or hydronephrosis visualized. No sonographically demonstrable calculus or ureterectasis.  Bladder:  Appears normal for degree of bladder distention. Prevoid measured volume is 351 mL. Postvoid volume measured at 71 mL.  IMPRESSION: 1. Left kidney larger than right kidney. Significance of this finding uncertain. This finding potentially could indicate renal artery stenosis on the right. In this regard, question whether patient is  hypertensive.  2.  Kidneys otherwise appear normal bilaterally.  3.  Relatively mild postvoid residual in urinary bladder.   Electronically Signed By: Lowella Grip III M.D. On: 08/17/2017 10:41  No valid procedures specified. Results for orders placed during the hospital encounter of 09/14/21  CT HEMATURIA WORKUP  Narrative CLINICAL DATA:  Hematuria, chronic urinary tract infections in a 67 year old female.  EXAM: CT ABDOMEN AND PELVIS WITHOUT AND WITH CONTRAST  TECHNIQUE: Multidetector CT imaging of the abdomen and pelvis was performed following the standard protocol before and following the bolus administration of intravenous contrast.  CONTRAST:  174m OMNIPAQUE IOHEXOL 350 MG/ML SOLN  COMPARISON:  None  FINDINGS: Lower chest: Incidental imaging of the lung bases is unremarkable. No effusion or sign of consolidative process.  Hepatobiliary: No focal, suspicious hepatic lesion. No pericholecystic stranding. No biliary duct dilation. Portal vein is patent.  Pancreas: Normal, without mass,  inflammation or ductal dilatation.  Spleen: Normal spleen.  Adrenals/Urinary Tract: Adrenal glands are normal.  Symmetric renal enhancement. No hydronephrosis. Punctate calculus in the interpolar LEFT kidney. Dense material at the bladder base likely multiple small calculi but not well characterized. No gross lesion in the urinary bladder though bladder distension limits assessment as the bladder is under distended on the current study. No suspicious renal lesion. No upper tract lesion on excretory images.  Stomach/Bowel: No acute gastrointestinal process. Appendix is normal.  Vascular/Lymphatic:  Aortic atherosclerosis. No sign of aneurysm. Smooth contour of the IVC. There is no gastrohepatic or hepatoduodenal ligament lymphadenopathy. No retroperitoneal or mesenteric lymphadenopathy.  No pelvic sidewall lymphadenopathy.  Reproductive: Post hysterectomy.  No adnexal masses.  Other: No ascites.  Musculoskeletal: No acute or significant osseous findings.  IMPRESSION: Dense material at the bladder base likely multiple small calculi but not well characterized. No gross lesion in the urinary bladder though bladder distension limits assessment as the bladder is under distended on the current study. Consider cystoscopy not recently performed to exclude plaque-like lesion with calcification.  Punctate LEFT nephrolithiasis. No ureteral calculus. No upper tract lesion or sign of stricture.  Aortic Atherosclerosis (ICD10-I70.0).   Electronically Signed By: GZetta BillsM.D. On: 09/14/2021 13:06  No results found for this or any previous visit.   Assessment & Plan:    1. Chronic cystitis with hematuria -patient to continue self start doxycycline  2. IC (interstitial cystitis) -Continue elmiron, atarax and elavil. Followup 1 year   No follow-ups on file.  PNicolette Bang MD  [facility] RJ[place]

## 2022-12-29 NOTE — Patient Instructions (Signed)
Eating Plan for Interstitial Cystitis Interstitial cystitis (IC) is a long-term (chronic) condition that causes pain and pressure in the bladder, the lower abdomen, and the pelvic area. Other symptoms of IC include urinary urgency and frequency. Symptoms tend to come and go. Many people with IC find that certain foods trigger their symptoms. Different foods may be problematic for different people. Some foods are more likely to cause symptoms than others. Learning which foods bother you and which do not can help you come up with an eating plan to manage IC. What are tips for following this plan? You may find it helpful to work with a dietitian. A dietician can help you develop an eating plan by doing an elimination diet. This diet involves: Creating a list of foods that you think trigger your IC symptoms combined with the foods that most commonly trigger symptoms for many people with IC. It may take several months to find out which foods bother you. Eliminating those foods from your diet for about one month, then reintroducing the foods one at a time to see which ones trigger your symptoms. Reading food labels Once you know which foods trigger your IC symptoms, you can avoid them. However, it is also a good idea to read food labels because some foods that trigger your symptoms may be included as ingredients in other foods. These ingredients may include: Soy. Worcestershire sauce. Vinegar. Alcohol. Artificial sweeteners. Monosodium glutamate. Other potential triggers include: Chili peppers. Tomato products. Citrus fruits, flavors, or juices. Shopping Shopping can be a challenge if many foods trigger your IC. When you go grocery shopping, bring a list of the foods you cannot eat. You can get an app for your phone that lets you know which foods are the safest and which you may want to avoid. You can find the app at the Interstitial Cystitis Network website: www.ic-network.com Meal planning Plan  your meals according to the results of your elimination diet. If you have not done an elimination diet, plan meals according to IC food lists recommended by your health care provider or dietitian. These lists tell you which foods are least and most likely to cause symptoms. Avoid certain types of food when you go out to eat, such as pizza and foods typically served at [place], [place], and [place]. These foods often contain ingredients that can aggravate IC. General information Here are some general guidelines for an IC eating plan: Do not eat large portions. Drink plenty of fluids with your meals. Do not eat foods that are high in sugar, salt, or saturated fat. Choose whole fruits instead of juice. Eat a colorful variety of vegetables. What foods should I eat? For people with IC, the best diet is a balanced one that includes things from all the food groups. Even if you have to avoid certain foods, there are still plenty of healthy choices in each group. The following are some foods that are least bothersome and may be safest to eat: Fruits Bananas. Blueberries and blueberry juice. Melons. Pears. Apples. Dates. Prunes. Raisins. Apricots. Vegetables Asparagus. Avocado. Celery. Beets. Bell peppers. Black olives. Broccoli. Brussels sprouts. Cabbage. Carrots. Cauliflower. Cucumber. Eggplant. Green beans. Potatoes. Radishes. Spinach. Squash. Turnips. Zucchini. Mushrooms. Peas. Grains Oats. Rice. Bran. Oatmeal. Whole wheat bread. Meats and other proteins Beef. Fish and other seafood. Eggs. Nuts. Peanut butter. Pork. Poultry. Lamb. Garbanzo beans. Pinto beans. Dairy Whole or low-fat milk. American, mozzarella, mild cheddar, feta, ricotta, and cream cheeses. The items listed above may not be a  complete list of foods and beverages you can eat. Contact a dietitian for more information. What foods should I avoid? You should avoid any foods that seem to trigger your symptoms. It is also a good  idea to avoid foods that are most likely to cause symptoms in many people with IC. These include the following: Fruits Citrus fruits, including lemons, limes, oranges, and grapefruit. Cranberries. Strawberries. Pineapple. Kiwi. Vegetables Chili peppers. Onions. Sauerkraut. Tomato and tomato products. Angie Fava. Grains You do not need to avoid any type of grain unless it triggers your symptoms. Meats and other proteins Precooked or cured meats, such as sausages or meat loaves. Soy products. Dairy Chocolate ice cream. Processed cheese. Yogurt. Beverages Alcohol. Chocolate drinks. Coffee. Cranberry juice. Carbonated drinks. Tea (black, green, or herbal). Tomato juice. Sports drinks. The items listed above may not be a complete list of foods and beverages you should avoid. Contact a dietitian for more information. Summary Many people with IC find that certain foods trigger their symptoms. Different foods may be problematic for different people. You may find it helpful to work with a dietitian to do an elimination diet and come up with an eating plan that is right for you. Plan your meals according to the results of your elimination diet. If you have not done an elimination diet, plan your meals using IC food lists. These lists tell you which foods are least and most likely to cause symptoms. The best diet for people with IC is a balanced diet that includes foods from all the food groups. Even if you have to avoid certain foods, there are still plenty of healthy choices in each group. This information is not intended to replace advice given to you by your health care provider. Make sure you discuss any questions you have with your health care provider. Document Revised: 01/17/2022 Document Reviewed: 01/17/2022 Elsevier Patient Education  [employer].

## 2023-01-04 ENCOUNTER — Encounter: Payer: Self-pay | Admitting: Urology

## 2023-04-13 ENCOUNTER — Telehealth: Payer: Self-pay

## 2023-04-13 NOTE — Telephone Encounter (Signed)
Patient requested Myrbeteriq 25 samples. Dr. Ronne Binning gave verbal ok to provide with samples.  Rx left at front desk

## 2023-05-06 ENCOUNTER — Other Ambulatory Visit: Payer: Self-pay | Admitting: Oncology

## 2023-05-06 DIAGNOSIS — Z1231 Encounter for screening mammogram for malignant neoplasm of breast: Secondary | ICD-10-CM

## 2023-09-09 ENCOUNTER — Ambulatory Visit
Admission: RE | Admit: 2023-09-09 | Discharge: 2023-09-09 | Disposition: A | Discharge status: Home / Self Care | Payer: Commercial Managed Care - PPO | Source: Ambulatory Visit | Attending: Oncology | Admitting: Oncology

## 2023-09-09 DIAGNOSIS — Z1231 Encounter for screening mammogram for malignant neoplasm of breast: Secondary | ICD-10-CM

## 2023-09-13 ENCOUNTER — Other Ambulatory Visit: Payer: Self-pay | Admitting: Oncology

## 2023-09-13 DIAGNOSIS — R928 Other abnormal and inconclusive findings on diagnostic imaging of breast: Secondary | ICD-10-CM

## 2023-09-29 ENCOUNTER — Encounter: Payer: Self-pay | Admitting: Oncology

## 2023-10-03 ENCOUNTER — Ambulatory Visit: Payer: Commercial Managed Care - PPO

## 2023-10-03 ENCOUNTER — Ambulatory Visit
Admission: RE | Admit: 2023-10-03 | Discharge: 2023-10-03 | Disposition: A | Discharge status: Home / Self Care | Payer: Commercial Managed Care - PPO | Source: Ambulatory Visit | Attending: Oncology | Admitting: Oncology

## 2023-10-03 DIAGNOSIS — R928 Other abnormal and inconclusive findings on diagnostic imaging of breast: Secondary | ICD-10-CM

## 2023-12-15 ENCOUNTER — Telehealth: Payer: Self-pay

## 2023-12-15 NOTE — Telephone Encounter (Signed)
Called patient to get updated insurance information to complete PA per insurance. Patient states she is no longer taking the hydroxyzine (atarax) 10 mg tablet.

## 2024-01-02 ENCOUNTER — Ambulatory Visit: Payer: Commercial Managed Care - PPO | Admitting: Urology

## 2024-02-08 ENCOUNTER — Ambulatory Visit: Payer: Commercial Managed Care - PPO | Admitting: Urology

## 2024-04-16 ENCOUNTER — Other Ambulatory Visit: Payer: Self-pay | Admitting: Urology

## 2024-04-16 DIAGNOSIS — Z1231 Encounter for screening mammogram for malignant neoplasm of breast: Secondary | ICD-10-CM

## 2024-04-18 ENCOUNTER — Ambulatory Visit: Payer: Commercial Managed Care - PPO | Admitting: Urology

## 2024-04-20 ENCOUNTER — Ambulatory Visit: Payer: Commercial Managed Care - PPO | Admitting: Urology

## 2024-04-20 VITALS — BP 148/80 | HR 94

## 2024-04-20 DIAGNOSIS — N3010 Interstitial cystitis (chronic) without hematuria: Secondary | ICD-10-CM

## 2024-04-20 DIAGNOSIS — N3021 Other chronic cystitis with hematuria: Secondary | ICD-10-CM | POA: Diagnosis not present

## 2024-04-20 DIAGNOSIS — N200 Calculus of kidney: Secondary | ICD-10-CM

## 2024-04-20 LAB — URINALYSIS, ROUTINE W REFLEX MICROSCOPIC
Bilirubin, UA: NEGATIVE
Glucose, UA: NEGATIVE
Ketones, UA: NEGATIVE
Nitrite, UA: POSITIVE — AB
Specific Gravity, UA: 1.025 (ref 1.005–1.030)
Urobilinogen, Ur: 1 mg/dL (ref 0.2–1.0)
pH, UA: 6 (ref 5.0–7.5)

## 2024-04-20 LAB — MICROSCOPIC EXAMINATION
RBC, Urine: >30 /HPF — AB (ref 0–2)
WBC, UA: >30 /HPF — AB (ref 0–5)

## 2024-04-20 MED ORDER — SULFAMETHOXAZOLE-TRIMETHOPRIM 800-160 MG PO TABS: 1.0000 | ORAL_TABLET | Freq: Two times a day (BID) | ORAL | 0 refills | Status: DC

## 2024-04-20 NOTE — Progress Notes (Signed)
 04/20/2024 1:01 PM   [name] May 06, 1956 [id number]  Referring provider: Health, [facility] [address] [place],  [place] [address]  Followup chronic cystitis  HPI: Ms Bove is a 68yo here for followup for chronic cystitis and IC. Since last visit she stopped mirabegron , elmiron , and elavil  since last visit. She has nocturia 2-3x. She takes aleve prn for her pelvic pain. For the last 2-3 weeks she has noted increased urinary urgency, frequency and intermittent dysuria. UA today is concerning for infection.   PMH: Past Medical History:  Diagnosis Date   Breast cancer (HCC) 2010   Right Breast Cancer   Cancer [facility])    Chronic urinary tract infection    GERD (gastroesophageal reflux disease)    HSV-1 infection    HSV-2 infection 2020    Surgical History: Past Surgical History:  Procedure Laterality Date   ABDOMINAL HYSTERECTOMY  August 2010   BREAST LUMPECTOMY Right 2010   BREAST SURGERY  2010   Breast cancer   COLONOSCOPY N/A 11/07/2014   Procedure: COLONOSCOPY;  Surgeon: Ruby Corporal, MD;  [facility];  Service: Endoscopy;  Laterality: N/A;  1200   CYSTOSCOPY WITH BIOPSY N/A 10/12/2021   Procedure: CYSTOSCOPY WITH BIOPSY AND FULGERATION;  Surgeon: Marco Severs, MD;  Location: [facility];  Service: Urology;  Laterality: N/A;   CYSTOSCOPY WITH LITHOLAPAXY N/A 10/12/2021   Procedure: CYSTOSCOPY WITH LITHOLAPAXY;  Surgeon: Marco Severs, MD;  Location: [facility];  Service: Urology;  Laterality: N/A;   ESOPHAGOGASTRODUODENOSCOPY N/A 09/18/2018   Procedure: ESOPHAGOGASTRODUODENOSCOPY (EGD);  Surgeon: Ruby Corporal, MD;  [facility];  Service: Endoscopy;  Laterality: N/A;  12:25    Home Medications:  Allergies as of 04/20/2024       Reactions   Pantoprazole     Patient states that it gave her headaches.   Macrobid [nitrofurantoin Macrocrystal]    Breathing problems. 02 level dropped        Medication List         Accurate as of April 20, 2024  1:01 PM. If you have any questions, ask your nurse or doctor.          STOP taking these medications    amitriptyline  25 MG tablet Commonly known as: ELAVIL    AZO DINE MAXIMUM STRENGTH PO   diazepam  5 MG tablet Commonly known as: Valium    doxycycline  100 MG tablet Commonly known as: ADOXA   Elmiron  100 MG capsule Generic drug: pentosan polysulfate   hydrOXYzine  10 MG tablet Commonly known as: ATARAX    mirabegron  ER 25 MG Tb24 tablet Commonly known as: MYRBETRIQ    OMEGA-3 FISH OIL PO   omeprazole  20 MG capsule Commonly known as: PRILOSEC   phenazopyridine  200 MG tablet Commonly known as: Pyridium    Uribel  118 MG Caps       TAKE these medications    CITRACAL CALCIUM GUMMIES PO Take 2 tablets by mouth daily.   multivitamin with minerals Tabs tablet Take 1 tablet by mouth daily.   Vitamin D3 50 MCG (2000 UT) capsule Take 2,000 Units by mouth every other day.        Allergies:  Allergies  Allergen Reactions   Pantoprazole      Patient states that it gave her headaches.   Macrobid [Nitrofurantoin Macrocrystal]     Breathing problems. 02 level dropped    Family History: Family History  Problem Relation Age of Onset   Breast cancer Mother 7   Asthma  Mother    Lung cancer Father    Bladder Cancer Brother    Bladder Cancer Maternal Grandfather    Lung cancer Brother     Social History:  reports that she has never smoked. She has never used smokeless tobacco. She reports that she does not drink alcohol and does not use drugs.  ROS: All other review of systems were reviewed and are negative except what is noted above in HPI  Physical Exam: BP (!) 148/80   Pulse 94   Constitutional:  Alert and oriented, No acute distress. HEENT: [place] AT, moist mucus membranes.  Trachea midline, no masses. Cardiovascular: No clubbing, cyanosis, or edema. Respiratory: Normal respiratory effort, no increased work of breathing. GI:  Abdomen is soft, nontender, nondistended, no abdominal masses GU: No CVA tenderness.  Lymph: No cervical or inguinal lymphadenopathy. Skin: No rashes, bruises or suspicious lesions. Neurologic: Grossly intact, no focal deficits, moving all 4 extremities. Psychiatric: Normal mood and affect.  Laboratory Data: Lab Results  Component Value Date   WBC 7.8 05/13/2018   HGB 13.1 05/13/2018   HCT 40.0 05/13/2018   MCV 92.8 05/13/2018   PLT 196 05/13/2018    Lab Results  Component Value Date   CREATININE 0.60 09/14/2021    No results found for: "PSA"  No results found for: "TESTOSTERONE"  No results found for: "HGBA1C"  Urinalysis    Component Value Date/Time   APPEARANCEUR Hazy (A) 12/31/2021 1513   GLUCOSEU Negative 12/31/2021 1513   BILIRUBINUR Negative 12/31/2021 1513   PROTEINUR Trace (A) 12/31/2021 1513   NITRITE Negative 12/31/2021 1513   LEUKOCYTESUR 2+ (A) 12/31/2021 1513    Lab Results  Component Value Date   LABMICR Comment 12/31/2021   WBCUA 11-30 (A) 12/22/2021   LABEPIT 0-10 12/22/2021   MUCUS Present 09/30/2021   BACTERIA Moderate (A) 12/22/2021    Pertinent Imaging:  No results found for this or any previous visit.  No results found for this or any previous visit.  No results found for this or any previous visit.  No results found for this or any previous visit.  Results for orders placed during the hospital encounter of 08/17/17  US  Renal  Narrative CLINICAL DATA:  Chronic cystitis  EXAM: RENAL / URINARY TRACT ULTRASOUND COMPLETE  COMPARISON:  None.  FINDINGS: Right Kidney:  Length: 10.7 cm. Echogenicity and renal cortical thickness are within normal limits. No mass, perinephric fluid, or hydronephrosis visualized. No sonographically demonstrable calculus or ureterectasis.  Left Kidney:  Length: 13.0 cm. Echogenicity and renal cortical thickness are within normal limits. No mass, perinephric fluid, or  hydronephrosis visualized. No sonographically demonstrable calculus or ureterectasis.  Bladder:  Appears normal for degree of bladder distention. Prevoid measured volume is 351 mL. Postvoid volume measured at 71 mL.  IMPRESSION: 1. Left kidney larger than right kidney. Significance of this finding uncertain. This finding potentially could indicate renal artery stenosis on the right. In this regard, question whether patient is hypertensive.  2.  Kidneys otherwise appear normal bilaterally.  3.  Relatively mild postvoid residual in urinary bladder.   Electronically Signed By: Mordecai Applebaum III M.D. On: 08/17/2017 10:41  No results found for this or any previous visit.  Results for orders placed during the hospital encounter of 09/14/21  CT HEMATURIA WORKUP  Narrative CLINICAL DATA:  Hematuria, chronic urinary tract infections in a 68 year old female.  EXAM: CT ABDOMEN AND PELVIS WITHOUT AND WITH CONTRAST  TECHNIQUE: Multidetector CT imaging of the abdomen and pelvis  was performed following the standard protocol before and following the bolus administration of intravenous contrast.  CONTRAST:  100mL OMNIPAQUE  IOHEXOL  350 MG/ML SOLN  COMPARISON:  None  FINDINGS: Lower chest: Incidental imaging of the lung bases is unremarkable. No effusion or sign of consolidative process.  Hepatobiliary: No focal, suspicious hepatic lesion. No pericholecystic stranding. No biliary duct dilation. Portal vein is patent.  Pancreas: Normal, without mass, inflammation or ductal dilatation.  Spleen: Normal spleen.  Adrenals/Urinary Tract: Adrenal glands are normal.  Symmetric renal enhancement. No hydronephrosis. Punctate calculus in the interpolar LEFT kidney. Dense material at the bladder base likely multiple small calculi but not well characterized. No gross lesion in the urinary bladder though bladder distension limits assessment as the bladder is under distended on the  current study. No suspicious renal lesion. No upper tract lesion on excretory images.  Stomach/Bowel: No acute gastrointestinal process. Appendix is normal.  Vascular/Lymphatic:  Aortic atherosclerosis. No sign of aneurysm. Smooth contour of the IVC. There is no gastrohepatic or hepatoduodenal ligament lymphadenopathy. No retroperitoneal or mesenteric lymphadenopathy.  No pelvic sidewall lymphadenopathy.  Reproductive: Post hysterectomy.  No adnexal masses.  Other: No ascites.  Musculoskeletal: No acute or significant osseous findings.  IMPRESSION: Dense material at the bladder base likely multiple small calculi but not well characterized. No gross lesion in the urinary bladder though bladder distension limits assessment as the bladder is under distended on the current study. Consider cystoscopy not recently performed to exclude plaque-like lesion with calcification.  Punctate LEFT nephrolithiasis. No ureteral calculus. No upper tract lesion or sign of stricture.  Aortic Atherosclerosis (ICD10-I70.0).   Electronically Signed By: Zara Heymann M.D. On: 09/14/2021 13:06  No results found for this or any previous visit.   Assessment & Plan:    1. Chronic cystitis with hematuria (Primary) Bactrim  DS. BID for 7 days - Urinalysis, Routine w reflex microscopic - Urine Culture  2. IC (interstitial cystitis) -patient defers therapy at this time   No follow-ups on file.  Johnie Nailer, MD  [facility] Urology [place]

## 2024-04-24 LAB — URINE CULTURE

## 2024-04-26 ENCOUNTER — Encounter: Payer: Self-pay | Admitting: Urology

## 2024-04-26 NOTE — Patient Instructions (Signed)

## 2024-05-15 ENCOUNTER — Telehealth: Payer: Self-pay

## 2024-05-15 NOTE — Telephone Encounter (Signed)
 Pt called stating she is seeing some blood on tissue when she wipes, states it comes and goes its not an excessive amount but noticeable pt is worried she is getting another infection per pt she completed her initial round of abx starting on 04/26 wants to know if she can get another Rx pt advised to do urine drop and to f/u w/ our office for scheduled time

## 2024-05-16 NOTE — Telephone Encounter (Signed)
 Patient asking if she can try the generic to [facility].  Please advise if this can be sent to her pharmacy.

## 2024-05-16 NOTE — Telephone Encounter (Signed)
 Will drop off urine Thursday May 22

## 2024-05-17 ENCOUNTER — Other Ambulatory Visit

## 2024-05-17 DIAGNOSIS — N3021 Other chronic cystitis with hematuria: Secondary | ICD-10-CM

## 2024-05-17 LAB — URINALYSIS, ROUTINE W REFLEX MICROSCOPIC
Bilirubin, UA: NEGATIVE
Glucose, UA: NEGATIVE
Ketones, UA: NEGATIVE
Nitrite, UA: POSITIVE — AB
Specific Gravity, UA: 1.015 (ref 1.005–1.030)
Urobilinogen, Ur: 0.2 mg/dL (ref 0.2–1.0)
pH, UA: 6.5 (ref 5.0–7.5)

## 2024-05-17 LAB — MICROSCOPIC EXAMINATION
RBC, Urine: >30 /HPF — AB (ref 0–2)
WBC, UA: >30 /HPF — AB (ref 0–5)

## 2024-05-17 MED ORDER — DOXYCYCLINE HYCLATE 100 MG PO CAPS: 100.0000 mg | ORAL_CAPSULE | Freq: Two times a day (BID) | ORAL | 0 refills | Status: DC

## 2024-05-17 NOTE — Telephone Encounter (Signed)
 Patient in office for scheduled lab urine drop off. UA results reviewed with Dr. Claretta Croft. Culture sent. Doxycyline 100 mg po bid x 7 days sent in per MD's request  Patient called with no answer. Detailed message left.

## 2024-05-20 LAB — URINE CULTURE

## 2024-05-24 ENCOUNTER — Telehealth: Payer: Self-pay

## 2024-05-24 NOTE — Telephone Encounter (Signed)
 Pt states she has seen some blood in her urine when she wipes and has no other symptoms. Pt states she's had crystals in her urine in the past and is concerned. Pt states she wants to have a cysto to check out her bladder due to bladder cancer running in her family. Pt also states she is on her second round of abx and doesn't believe they will work. Pt was scheduled for a cysto with the advisement that just b/c she is scheduled for the cysto does not insure the procedure will be performed that will be based on MD recommendations at time of appt

## 2024-05-29 NOTE — Telephone Encounter (Signed)
Pt scheduled mychart message sent

## 2024-07-04 ENCOUNTER — Encounter: Payer: Self-pay | Admitting: Urology

## 2024-07-04 ENCOUNTER — Ambulatory Visit: Admitting: Urology

## 2024-07-04 DIAGNOSIS — N3021 Other chronic cystitis with hematuria: Secondary | ICD-10-CM

## 2024-07-04 DIAGNOSIS — N3010 Interstitial cystitis (chronic) without hematuria: Secondary | ICD-10-CM

## 2024-07-04 DIAGNOSIS — N3281 Overactive bladder: Secondary | ICD-10-CM

## 2024-07-04 LAB — MICROSCOPIC EXAMINATION
RBC, Urine: >30 /HPF — AB (ref 0–2)
WBC, UA: >30 /HPF — AB (ref 0–5)

## 2024-07-04 LAB — URINALYSIS, ROUTINE W REFLEX MICROSCOPIC
Bilirubin, UA: NEGATIVE
Glucose, UA: NEGATIVE
Ketones, UA: NEGATIVE
Nitrite, UA: POSITIVE — AB
Specific Gravity, UA: 1.02 (ref 1.005–1.030)
Urobilinogen, Ur: 0.2 mg/dL (ref 0.2–1.0)
pH, UA: 6 (ref 5.0–7.5)

## 2024-07-04 MED ORDER — AMOXICILLIN-POT CLAVULANATE 875-125 MG PO TABS: 1.0000 | ORAL_TABLET | Freq: Two times a day (BID) | ORAL | 0 refills | Status: DC

## 2024-07-04 MED ORDER — GEMTESA 75 MG PO TABS: 1.0000 | ORAL_TABLET | Freq: Every day | ORAL | Status: DC

## 2024-07-04 NOTE — Progress Notes (Signed)
 07/04/2024 1:24 PM   Lamarr LOISE Faden 16-Jul-1956 [id number]  Referring provider: No referring provider defined for this encounter.  Concern for UTI   HPI: [name] is a 29bn here for followup for recurrent UTI. She was scheduled for cystoscopy today but her urine is concerning for infection. This is her third UTI in the past 3 months. She has had worsening urinary urgency, frequency and incontinence. She previously tried mirabegron  which improved her OAB symptoms   PMH: Past Medical History:  Diagnosis Date   Breast cancer (HCC) 2010   Right Breast Cancer   Cancer [facility])    Chronic urinary tract infection    GERD (gastroesophageal reflux disease)    HSV-1 infection    HSV-2 infection 2020    Surgical History: Past Surgical History:  Procedure Laterality Date   ABDOMINAL HYSTERECTOMY  August 2010   BREAST LUMPECTOMY Right 2010   BREAST SURGERY  2010   Breast cancer   COLONOSCOPY N/A 11/07/2014   Procedure: COLONOSCOPY;  Surgeon: Claudis RAYMOND Rivet, MD;  [facility];  Service: Endoscopy;  Laterality: N/A;  1200   CYSTOSCOPY WITH BIOPSY N/A 10/12/2021   Procedure: CYSTOSCOPY WITH BIOPSY AND FULGERATION;  Surgeon: Sherrilee Belvie CROME, MD;  Location: [facility];  Service: Urology;  Laterality: N/A;   CYSTOSCOPY WITH LITHOLAPAXY N/A 10/12/2021   Procedure: CYSTOSCOPY WITH LITHOLAPAXY;  Surgeon: Sherrilee Belvie CROME, MD;  Location: [facility];  Service: Urology;  Laterality: N/A;   ESOPHAGOGASTRODUODENOSCOPY N/A 09/18/2018   Procedure: ESOPHAGOGASTRODUODENOSCOPY (EGD);  Surgeon: Rivet Claudis RAYMOND, MD;  [facility];  Service: Endoscopy;  Laterality: N/A;  12:25    Home Medications:  Allergies as of 07/04/2024       Reactions   Pantoprazole     Patient states that it gave her headaches.   Macrobid [nitrofurantoin Macrocrystal]    Breathing problems. 02 level dropped        Medication List        Accurate as of July 04, 2024  1:24 PM. If you have any  questions, ask your nurse or doctor.          CITRACAL CALCIUM GUMMIES PO Take 2 tablets by mouth daily.   doxycycline  100 MG capsule Commonly known as: VIBRAMYCIN  Take 1 capsule (100 mg total) by mouth every 12 (twelve) hours. Please take with food.   multivitamin with minerals Tabs tablet Take 1 tablet by mouth daily.   sulfamethoxazole -trimethoprim  800-160 MG tablet Commonly known as: BACTRIM  DS Take 1 tablet by mouth every 12 (twelve) hours.   Vitamin D3 50 MCG (2000 UT) capsule Take 2,000 Units by mouth every other day.        Allergies:  Allergies  Allergen Reactions   Pantoprazole      Patient states that it gave her headaches.   Macrobid [Nitrofurantoin Macrocrystal]     Breathing problems. 02 level dropped    Family History: Family History  Problem Relation Age of Onset   Breast cancer Mother 19   Asthma Mother    Lung cancer Father    Bladder Cancer Brother    Bladder Cancer Maternal Grandfather    Lung cancer Brother     Social History:  reports that she has never smoked. She has never used smokeless tobacco. She reports that she does not drink alcohol and does not use drugs.  ROS: All other review of systems were reviewed and are negative except what is noted above in HPI  Physical Exam: There were  no vitals taken for this visit.  Constitutional:  Alert and oriented, No acute distress. HEENT: [place] AT, moist mucus membranes.  Trachea midline, no masses. Cardiovascular: No clubbing, cyanosis, or edema. Respiratory: Normal respiratory effort, no increased work of breathing. GI: Abdomen is soft, nontender, nondistended, no abdominal masses GU: No CVA tenderness.  Lymph: No cervical or inguinal lymphadenopathy. Skin: No rashes, bruises or suspicious lesions. Neurologic: Grossly intact, no focal deficits, moving all 4 extremities. Psychiatric: Normal mood and affect.  Laboratory Data: Lab Results  Component Value Date   WBC 7.8 05/13/2018   HGB  13.1 05/13/2018   HCT 40.0 05/13/2018   MCV 92.8 05/13/2018   PLT 196 05/13/2018    Lab Results  Component Value Date   CREATININE 0.60 09/14/2021    No results found for: PSA  No results found for: TESTOSTERONE  No results found for: HGBA1C  Urinalysis    Component Value Date/Time   APPEARANCEUR Clear 05/17/2024 1116   GLUCOSEU Negative 05/17/2024 1116   BILIRUBINUR Negative 05/17/2024 1116   PROTEINUR 2+ (A) 05/17/2024 1116   NITRITE Positive (A) 05/17/2024 1116   LEUKOCYTESUR 3+ (A) 05/17/2024 1116    Lab Results  Component Value Date   LABMICR See below: 05/17/2024   WBCUA >30 (A) 05/17/2024   LABEPIT 0-10 05/17/2024   MUCUS Present 09/30/2021   BACTERIA Many (A) 05/17/2024    Pertinent Imaging:  No results found for this or any previous visit.  No results found for this or any previous visit.  No results found for this or any previous visit.  No results found for this or any previous visit.  Results for orders placed during the hospital encounter of 08/17/17  US  Renal  Narrative CLINICAL DATA:  Chronic cystitis  EXAM: RENAL / URINARY TRACT ULTRASOUND COMPLETE  COMPARISON:  None.  FINDINGS: Right Kidney:  Length: 10.7 cm. Echogenicity and renal cortical thickness are within normal limits. No mass, perinephric fluid, or hydronephrosis visualized. No sonographically demonstrable calculus or ureterectasis.  Left Kidney:  Length: 13.0 cm. Echogenicity and renal cortical thickness are within normal limits. No mass, perinephric fluid, or hydronephrosis visualized. No sonographically demonstrable calculus or ureterectasis.  Bladder:  Appears normal for degree of bladder distention. Prevoid measured volume is 351 mL. Postvoid volume measured at 71 mL.  IMPRESSION: 1. Left kidney larger than right kidney. Significance of this finding uncertain. This finding potentially could indicate renal artery stenosis on the right. In this regard,  question whether patient is hypertensive.  2.  Kidneys otherwise appear normal bilaterally.  3.  Relatively mild postvoid residual in urinary bladder.   Electronically Signed By: Elsie Repine III M.D. On: 08/17/2017 10:41  No results found for this or any previous visit.  Results for orders placed during the hospital encounter of 09/14/21  CT HEMATURIA WORKUP  Narrative CLINICAL DATA:  Hematuria, chronic urinary tract infections in a 68 year old female.  EXAM: CT ABDOMEN AND PELVIS WITHOUT AND WITH CONTRAST  TECHNIQUE: Multidetector CT imaging of the abdomen and pelvis was performed following the standard protocol before and following the bolus administration of intravenous contrast.  CONTRAST:  OMNIPAQUE  IOHEXOL  350 MG/ML SOLN  COMPARISON:  None  FINDINGS: Lower chest: Incidental imaging of the lung bases is unremarkable. No effusion or sign of consolidative process.  Hepatobiliary: No focal, suspicious hepatic lesion. No pericholecystic stranding. No biliary duct dilation. Portal vein is patent.  Pancreas: Normal, without mass, inflammation or ductal dilatation.  Spleen: Normal spleen.  Adrenals/Urinary Tract:  Adrenal glands are normal.  Symmetric renal enhancement. No hydronephrosis. Punctate calculus in the interpolar LEFT kidney. Dense material at the bladder base likely multiple small calculi but not well characterized. No gross lesion in the urinary bladder though bladder distension limits assessment as the bladder is under distended on the current study. No suspicious renal lesion. No upper tract lesion on excretory images.  Stomach/Bowel: No acute gastrointestinal process. Appendix is normal.  Vascular/Lymphatic:  Aortic atherosclerosis. No sign of aneurysm. Smooth contour of the IVC. There is no gastrohepatic or hepatoduodenal ligament lymphadenopathy. No retroperitoneal or mesenteric lymphadenopathy.  No pelvic sidewall  lymphadenopathy.  Reproductive: Post hysterectomy.  No adnexal masses.  Other: No ascites.  Musculoskeletal: No acute or significant osseous findings.  IMPRESSION: Dense material at the bladder base likely multiple small calculi but not well characterized. No gross lesion in the urinary bladder though bladder distension limits assessment as the bladder is under distended on the current study. Consider cystoscopy not recently performed to exclude plaque-like lesion with calcification.  Punctate LEFT nephrolithiasis. No ureteral calculus. No upper tract lesion or sign of stricture.  Aortic Atherosclerosis (ICD10-I70.0).   Electronically Signed By: Isla Blind M.D. On: 09/14/2021 13:06  No results found for this or any previous visit.   Assessment & Plan:    1.  Chronic cystitis with hematuria Urine for culture -augemntin 875 BID for 7 days -followup 1 week for cystoscopy  2. OAB -We will trial gemtesa  75mg  daily.    No follow-ups on file.  Belvie Clara, MD  [facility] Urology [place]

## 2024-07-04 NOTE — Patient Instructions (Signed)

## 2024-07-07 LAB — URINE CULTURE

## 2024-07-11 ENCOUNTER — Ambulatory Visit (INDEPENDENT_AMBULATORY_CARE_PROVIDER_SITE_OTHER): Admitting: Urology

## 2024-07-11 ENCOUNTER — Encounter: Payer: Self-pay | Admitting: Urology

## 2024-07-11 VITALS — BP 151/78

## 2024-07-11 DIAGNOSIS — R319 Hematuria, unspecified: Secondary | ICD-10-CM

## 2024-07-11 DIAGNOSIS — N390 Urinary tract infection, site not specified: Secondary | ICD-10-CM | POA: Diagnosis not present

## 2024-07-11 DIAGNOSIS — N3281 Overactive bladder: Secondary | ICD-10-CM

## 2024-07-11 LAB — URINALYSIS, ROUTINE W REFLEX MICROSCOPIC
Bilirubin, UA: NEGATIVE
Nitrite, UA: POSITIVE — AB
Specific Gravity, UA: 1.025 (ref 1.005–1.030)
Urobilinogen, Ur: 4 mg/dL — ABNORMAL HIGH (ref 0.2–1.0)
pH, UA: 6.5 (ref 5.0–7.5)

## 2024-07-11 LAB — MICROSCOPIC EXAMINATION: RBC, Urine: >30 /HPF — AB (ref 0–2)

## 2024-07-11 MED ORDER — DOXYCYCLINE HYCLATE 100 MG PO CAPS: 100.0000 mg | ORAL_CAPSULE | Freq: Two times a day (BID) | ORAL | 0 refills | Status: DC

## 2024-07-11 MED ORDER — GEMTESA 75 MG PO TABS
1.0000 | ORAL_TABLET | Freq: Every day | ORAL | 0 refills | Status: DC
Start: 2024-07-11 — End: 2024-08-08

## 2024-07-11 NOTE — Patient Instructions (Signed)

## 2024-07-11 NOTE — Progress Notes (Signed)
 07/11/2024 8:27 AM   [name] 29-Dec-1955 [id number]  Referring provider: Joshua Rayfield RAMAN, NP [address] [place],  [place] [address]  Followup frequent UTI   HPI: [name] is a 68yo here for followuo for frequent UTI. Last week she was seen for a UTI and started on macrobid. Culture grew pan sensitive E coli. She then developed gross hematuria 3 days ago with clots. The hematuria resolved and then she had gross hematuria yesterday. UA today is concerning for infection. She notes since starting gemtesa  her urinary urgency, frequency, and incontinence resolved   PMH: Past Medical History:  Diagnosis Date   Breast cancer (HCC) 2010   Right Breast Cancer   Cancer (HCC)    Chronic urinary tract infection    GERD (gastroesophageal reflux disease)    HSV-1 infection    HSV-2 infection 2020    Surgical History: Past Surgical History:  Procedure Laterality Date   ABDOMINAL HYSTERECTOMY  August 2010   BREAST LUMPECTOMY Right 2010   BREAST SURGERY  2010   Breast cancer   COLONOSCOPY N/A 11/07/2014   Procedure: COLONOSCOPY;  Surgeon: Claudis RAYMOND Rivet, MD;  [facility]: [facility];  Service: Endoscopy;  Laterality: N/A;  1200   CYSTOSCOPY WITH BIOPSY N/A 10/12/2021   Procedure: CYSTOSCOPY WITH BIOPSY AND FULGERATION;  Surgeon: Sherrilee Belvie CROME, MD;  Location: [facility];  Service: Urology;  Laterality: N/A;   CYSTOSCOPY WITH LITHOLAPAXY N/A 10/12/2021   Procedure: CYSTOSCOPY WITH LITHOLAPAXY;  Surgeon: Sherrilee Belvie CROME, MD;  Location: [facility];  Service: Urology;  Laterality: N/A;   ESOPHAGOGASTRODUODENOSCOPY N/A 09/18/2018   Procedure: ESOPHAGOGASTRODUODENOSCOPY (EGD);  Surgeon: Rivet Claudis RAYMOND, MD;  Location: [facility];  Service: Endoscopy;  Laterality: N/A;  12:25    Home Medications:  Allergies as of 07/11/2024       Reactions   Pantoprazole     Patient states that it gave her headaches.   Macrobid [nitrofurantoin Macrocrystal]    Breathing problems.  02 level dropped        Medication List        Accurate as of July 11, 2024  8:27 AM. If you have any questions, ask your nurse or doctor.          STOP taking these medications    amoxicillin -clavulanate 875-125 MG tablet Commonly known as: AUGMENTIN    sulfamethoxazole -trimethoprim  800-160 MG tablet Commonly known as: BACTRIM  DS       TAKE these medications    CITRACAL CALCIUM GUMMIES PO Take 2 tablets by mouth daily.   doxycycline  100 MG capsule Commonly known as: VIBRAMYCIN  Take 1 capsule (100 mg total) by mouth every 12 (twelve) hours. What changed: additional instructions   Gemtesa  75 MG Tabs Generic drug: Vibegron  Take 1 tablet (75 mg total) by mouth daily.   multivitamin with minerals Tabs tablet Take 1 tablet by mouth daily.   Vitamin D3 50 MCG (2000 UT) capsule Take 2,000 Units by mouth every other day.        Allergies:  Allergies  Allergen Reactions   Pantoprazole      Patient states that it gave her headaches.   Macrobid [Nitrofurantoin Macrocrystal]     Breathing problems. 02 level dropped    Family History: Family History  Problem Relation Age of Onset   Breast cancer Mother 36   Asthma Mother    Lung cancer Father    Bladder Cancer Brother    Bladder Cancer Maternal Grandfather    Lung cancer  Brother     Social History:  reports that she has never smoked. She has never used smokeless tobacco. She reports that she does not drink alcohol and does not use drugs.  ROS: All other review of systems were reviewed and are negative except what is noted above in HPI  Physical Exam: There were no vitals taken for this visit.  Constitutional:  Alert and oriented, No acute distress. HEENT: [place] AT, moist mucus membranes.  Trachea midline, no masses. Cardiovascular: No clubbing, cyanosis, or edema. Respiratory: Normal respiratory effort, no increased work of breathing. GI: Abdomen is soft, nontender, nondistended, no abdominal masses GU:  No CVA tenderness.  Lymph: No cervical or inguinal lymphadenopathy. Skin: No rashes, bruises or suspicious lesions. Neurologic: Grossly intact, no focal deficits, moving all 4 extremities. Psychiatric: Normal mood and affect.  Laboratory Data: Lab Results  Component Value Date   WBC 7.8 05/13/2018   HGB 13.1 05/13/2018   HCT 40.0 05/13/2018   MCV 92.8 05/13/2018   PLT 196 05/13/2018    Lab Results  Component Value Date   CREATININE 0.60 09/14/2021    No results found for: PSA  No results found for: TESTOSTERONE  No results found for: HGBA1C  Urinalysis    Component Value Date/Time   APPEARANCEUR Clear 07/04/2024 1319   GLUCOSEU Negative 07/04/2024 1319   BILIRUBINUR Negative 07/04/2024 1319   PROTEINUR 1+ (A) 07/04/2024 1319   NITRITE Positive (A) 07/04/2024 1319   LEUKOCYTESUR 3+ (A) 07/04/2024 1319    Lab Results  Component Value Date   LABMICR See below: 07/04/2024   WBCUA >30 (A) 07/04/2024   LABEPIT 0-10 07/04/2024   MUCUS Present 09/30/2021   BACTERIA Many (A) 07/04/2024    Pertinent Imaging:  No results found for this or any previous visit.  No results found for this or any previous visit.  No results found for this or any previous visit.  No results found for this or any previous visit.  Results for orders placed during the hospital encounter of 08/17/17  US  Renal  Narrative CLINICAL DATA:  Chronic cystitis  EXAM: RENAL / URINARY TRACT ULTRASOUND COMPLETE  COMPARISON:  None.  FINDINGS: Right Kidney:  Length: 10.7 cm. Echogenicity and renal cortical thickness are within normal limits. No mass, perinephric fluid, or hydronephrosis visualized. No sonographically demonstrable calculus or ureterectasis.  Left Kidney:  Length: 13.0 cm. Echogenicity and renal cortical thickness are within normal limits. No mass, perinephric fluid, or hydronephrosis visualized. No sonographically demonstrable calculus  or ureterectasis.  Bladder:  Appears normal for degree of bladder distention. Prevoid measured volume is 351 mL. Postvoid volume measured at 71 mL.  IMPRESSION: 1. Left kidney larger than right kidney. Significance of this finding uncertain. This finding potentially could indicate renal artery stenosis on the right. In this regard, question whether patient is hypertensive.  2.  Kidneys otherwise appear normal bilaterally.  3.  Relatively mild postvoid residual in urinary bladder.   Electronically Signed By: Elsie Repine III M.D. On: 08/17/2017 10:41  No results found for this or any previous visit.  Results for orders placed during the hospital encounter of 09/14/21  CT HEMATURIA WORKUP  Narrative CLINICAL DATA:  Hematuria, chronic urinary tract infections in a 68 year old female.  EXAM: CT ABDOMEN AND PELVIS WITHOUT AND WITH CONTRAST  TECHNIQUE: Multidetector CT imaging of the abdomen and pelvis was performed following the standard protocol before and following the bolus administration of intravenous contrast.  CONTRAST:  OMNIPAQUE  IOHEXOL  350 MG/ML SOLN  COMPARISON:  None  FINDINGS: Lower chest: Incidental imaging of the lung bases is unremarkable. No effusion or sign of consolidative process.  Hepatobiliary: No focal, suspicious hepatic lesion. No pericholecystic stranding. No biliary duct dilation. Portal vein is patent.  Pancreas: Normal, without mass, inflammation or ductal dilatation.  Spleen: Normal spleen.  Adrenals/Urinary Tract: Adrenal glands are normal.  Symmetric renal enhancement. No hydronephrosis. Punctate calculus in the interpolar LEFT kidney. Dense material at the bladder base likely multiple small calculi but not well characterized. No gross lesion in the urinary bladder though bladder distension limits assessment as the bladder is under distended on the current study. No suspicious renal lesion. No upper tract lesion on  excretory images.  Stomach/Bowel: No acute gastrointestinal process. Appendix is normal.  Vascular/Lymphatic:  Aortic atherosclerosis. No sign of aneurysm. Smooth contour of the IVC. There is no gastrohepatic or hepatoduodenal ligament lymphadenopathy. No retroperitoneal or mesenteric lymphadenopathy.  No pelvic sidewall lymphadenopathy.  Reproductive: Post hysterectomy.  No adnexal masses.  Other: No ascites.  Musculoskeletal: No acute or significant osseous findings.  IMPRESSION: Dense material at the bladder base likely multiple small calculi but not well characterized. No gross lesion in the urinary bladder though bladder distension limits assessment as the bladder is under distended on the current study. Consider cystoscopy not recently performed to exclude plaque-like lesion with calcification.  Punctate LEFT nephrolithiasis. No ureteral calculus. No upper tract lesion or sign of stricture.  Aortic Atherosclerosis (ICD10-I70.0).   Electronically Signed By: Isla Blind M.D. On: 09/14/2021 13:06  No results found for this or any previous visit.   Assessment & Plan:    1. Urinary tract infection with hematuria, site unspecified (Primary) -doxycycline  100mg  BID for 7 days - Urinalysis, Routine w reflex microscopic - doxycycline  (VIBRAMYCIN ) 100 MG capsule; Take 1 capsule (100 mg total) by mouth every 12 (twelve) hours.  Dispense: 14 capsule; Refill: 0  2. OAB -refill gemtesa    No follow-ups on file.  Belvie Clara, MD  [facility] [place]

## 2024-07-18 ENCOUNTER — Ambulatory Visit: Admitting: Urology

## 2024-07-18 VITALS — BP 147/82 | HR 120

## 2024-07-18 DIAGNOSIS — N390 Urinary tract infection, site not specified: Secondary | ICD-10-CM

## 2024-07-18 DIAGNOSIS — R310 Gross hematuria: Secondary | ICD-10-CM

## 2024-07-18 DIAGNOSIS — N329 Bladder disorder, unspecified: Secondary | ICD-10-CM

## 2024-07-18 LAB — URINALYSIS, ROUTINE W REFLEX MICROSCOPIC
Bilirubin, UA: NEGATIVE
Glucose, UA: NEGATIVE
Ketones, UA: NEGATIVE
Nitrite, UA: NEGATIVE
Specific Gravity, UA: 1.02 (ref 1.005–1.030)
Urobilinogen, Ur: 0.2 mg/dL (ref 0.2–1.0)
pH, UA: 6 (ref 5.0–7.5)

## 2024-07-18 LAB — MICROSCOPIC EXAMINATION
RBC, Urine: >30 /HPF — AB (ref 0–2)
WBC, UA: >30 /HPF — AB (ref 0–5)

## 2024-07-18 MED ORDER — CIPROFLOXACIN HCL 500 MG PO TABS
500.0000 mg | ORAL_TABLET | Freq: Once | ORAL | Status: AC
  Administered 2024-07-18: 500 mg via ORAL

## 2024-07-18 NOTE — Progress Notes (Signed)
   07/18/24  CC: gross hematuria   HPI: [name] is a 68yo here for cystoscopy for gross hematuria Blood pressure (!) 147/82, pulse (!) 120. NED. A&Ox3.   No respiratory distress   Abd soft, NT, ND Normal external genitalia with patent urethral meatus  Cystoscopy Procedure Note  Patient identification was confirmed, informed consent was obtained, and patient was prepped using Betadine solution.  Lidocaine  jelly was administered per urethral meatus.    Procedure: - Flexible cystoscope introduced, without any difficulty.   - Thorough search of the bladder revealed:    normal urethral meatus    normal urothelium    no stones    no ulcers     no tumors    no urethral polyps    no trabeculation  - Ureteral orifices were normal in position and appearance.  Post-Procedure: - Patient tolerated the procedure well  Assessment/ Plan: Schedule fro bladder biopsy with fulgeration    No follow-ups on file.  Belvie Clara, MD

## 2024-07-24 ENCOUNTER — Encounter: Payer: Self-pay | Admitting: Urology

## 2024-07-24 NOTE — Patient Instructions (Signed)
 Transurethral Resection of Bladder Tumor  Transurethral resection of a bladder tumor is the removal (resection) of cancerous tissue (tumor) from the inside wall of the bladder. The bladder is the organ that holds urine. The tumor is removed through the tube that carries urine out of the body (urethra). In a transurethral resection, a thin telescope with a light, a tiny camera, and an electric cutting edge (resectoscope) is passed through the urethra. In men, the opening of the urethra is at the end of the penis. In women, it is just above the opening of the vagina. Tell a health care provider about: Any allergies you have. All medicines you are taking, including vitamins, herbs, eye drops, creams, and over-the-counter medicines. Any problems you or family members have had with anesthetic medicines. Any bleeding problems you have. Any surgeries you have had. Any medical conditions you have, including recent urinary tract infections. Whether you are pregnant or may be pregnant. What are the risks? Generally, this is a safe procedure. However, problems may occur, including: Infection. Bleeding. Allergic reactions to medicines. Damage to nearby structures or organs. Difficulty urinating from blockage of the urethra or not being able to urinate (urinary retention). Deep vein thrombosis. This is a blood clot that can develop in your leg. Recurring cancer. What happens before the procedure? When to stop eating and drinking Follow instructions from your health care provider about what you may eat and drink before your procedure. These may include: 8 hours before your procedure Stop eating most foods. Do not eat meat, fried foods, or fatty foods. Eat only light foods, such as toast or crackers. All liquids are okay except energy drinks and alcohol. 6 hours before your procedure Stop eating. Drink only clear liquids, such as water, clear fruit juice, black coffee, plain tea, and sports  drinks. Do not drink energy drinks or alcohol. 2 hours before your procedure Stop drinking all liquids. You may be allowed to take medicines with small sips of water. Medicines Ask your health care provider about: Changing or stopping your regular medicines. This is especially important if you are taking diabetes medicines or blood thinners. Taking medicines such as aspirin and ibuprofen. These medicines can thin your blood. Do not take these medicines unless your health care provider tells you to take them. Taking over-the-counter medicines, vitamins, herbs, and supplements. General instructions If you will be going home right after the procedure, plan to have a responsible adult: Take you home from the hospital or clinic. You will not be allowed to drive. Care for you for the time you are told. Ask your health care provider what steps will be taken to help prevent infection. These steps may include: Washing skin with a germ-killing soap. Taking antibiotic medicine. Do not use any products that contain nicotine or tobacco for at least 4 weeks before the procedure. These products include cigarettes, chewing tobacco, and vaping devices, such as e-cigarettes. If you need help quitting, ask your health care provider. What happens during the procedure? An IV will be inserted into one of your veins. You will be given one or more of the following: A medicine to help you relax (sedative). A medicine that is injected into your spine to numb the area below and slightly above the injection site (spinal anesthetic). A medicine that is injected into an area of your body to numb everything below the injection site (regional anesthetic). A medicine to make you fall asleep (general anesthetic). Your legs will be placed in foot rests (  stirrups) to open your legs and bend your knees. The resectoscope will be passed through your urethra and into your bladder. The part of your bladder with the tumor will be  resected by the cutting edge of the resectoscope. Fluid will be passed to rinse out the cut tissues (irrigation). The resectoscope will then be taken out. A small, thin tube (catheter) will be passed through your urethra and into your bladder. The catheter will drain urine into a bag outside of your body. The procedure may vary among health care providers and hospitals. What happens after the procedure? Your blood pressure, heart rate, breathing rate, and blood oxygen level will be monitored until you leave the hospital or clinic. You may continue to receive fluids and medicines through an IV. You will be given pain medicine to relieve pain. You will have a catheter to drain your urine. The amount of urine will be measured. If you have blood in your urine, your bladder may be rinsed out by passing fluid through your catheter. You will be encouraged to walk as soon as you can. You may have to wear compression stockings. These stockings help to prevent blood clots and reduce swelling in your legs. If you were given a sedative during the procedure, it can affect you for several hours. Do not drive or operate machinery until your health care provider says that it is safe. Summary Transurethral resection of a bladder tumor is the removal (resection) of a cancerous growth (tumor) on the inside wall of the bladder. To do this procedure, your health care provider uses a thin telescope with a light, a tiny camera, and an electric cutting edge (resectoscope) that is guided to your bladder through your urethra. The part of your bladder that is affected by the tumor will be resected by the cutting edge of the resectoscope. A catheter will be passed through your urethra and into your bladder. The catheter will drain urine into a bag outside of your body. If you will be going home right after the procedure, plan to have a responsible adult take you home from the hospital or clinic. You will not be allowed to  drive. This information is not intended to replace advice given to you by your health care provider. Make sure you discuss any questions you have with your health care provider. Document Revised: 12/18/2021 Document Reviewed: 12/18/2021 Elsevier Patient Education  2024 [employer].

## 2024-07-31 ENCOUNTER — Telehealth: Payer: Self-pay

## 2024-07-31 NOTE — Telephone Encounter (Signed)
 Patient called to f/u on bladder biopsy with fulgeration. Patient is made aware the surgery scheduler should be reaching out to scheduled surgery. Patient voiced understanding.

## 2024-08-02 ENCOUNTER — Other Ambulatory Visit: Payer: Self-pay | Admitting: Urology

## 2024-08-17 ENCOUNTER — Telehealth: Payer: Self-pay

## 2024-08-17 NOTE — Telephone Encounter (Signed)
 Tried return call to patient with no answer. Left detailed vm making her aware Pre-admission testing will call her with arrival time.

## 2024-09-05 ENCOUNTER — Other Ambulatory Visit: Payer: Self-pay

## 2024-09-05 ENCOUNTER — Encounter (HOSPITAL_COMMUNITY): Payer: Self-pay

## 2024-09-05 ENCOUNTER — Encounter (HOSPITAL_COMMUNITY)
Admission: RE | Admit: 2024-09-05 | Discharge: 2024-09-05 | Disposition: A | Discharge status: Home / Self Care | Source: Ambulatory Visit | Attending: Urology | Admitting: Urology

## 2024-09-10 ENCOUNTER — Encounter (HOSPITAL_COMMUNITY)
Admission: RE | Disposition: A | Discharge status: Home / Self Care | Payer: Self-pay | Source: Home / Self Care | Attending: Urology

## 2024-09-10 ENCOUNTER — Ambulatory Visit (HOSPITAL_BASED_OUTPATIENT_CLINIC_OR_DEPARTMENT_OTHER): Payer: Self-pay | Admitting: Anesthesiology

## 2024-09-10 ENCOUNTER — Ambulatory Visit (HOSPITAL_COMMUNITY): Payer: Self-pay | Admitting: Anesthesiology

## 2024-09-10 ENCOUNTER — Encounter (HOSPITAL_COMMUNITY): Payer: Self-pay | Admitting: Urology

## 2024-09-10 ENCOUNTER — Other Ambulatory Visit: Payer: Self-pay

## 2024-09-10 ENCOUNTER — Ambulatory Visit (HOSPITAL_COMMUNITY)
Admission: RE | Admit: 2024-09-10 | Discharge: 2024-09-10 | Disposition: A | Discharge status: Home / Self Care | Attending: Urology | Admitting: Urology

## 2024-09-10 DIAGNOSIS — K219 Gastro-esophageal reflux disease without esophagitis: Secondary | ICD-10-CM | POA: Insufficient documentation

## 2024-09-10 DIAGNOSIS — N329 Bladder disorder, unspecified: Secondary | ICD-10-CM | POA: Diagnosis not present

## 2024-09-10 DIAGNOSIS — I10 Essential (primary) hypertension: Secondary | ICD-10-CM | POA: Insufficient documentation

## 2024-09-10 DIAGNOSIS — N3000 Acute cystitis without hematuria: Secondary | ICD-10-CM | POA: Diagnosis not present

## 2024-09-10 DIAGNOSIS — N3020 Other chronic cystitis without hematuria: Secondary | ICD-10-CM

## 2024-09-10 DIAGNOSIS — R310 Gross hematuria: Secondary | ICD-10-CM | POA: Diagnosis present

## 2024-09-10 DIAGNOSIS — N3289 Other specified disorders of bladder: Secondary | ICD-10-CM

## 2024-09-10 HISTORY — PX: CYSTOSCOPY WITH BIOPSY: SHX5122

## 2024-09-10 SURGERY — CYSTOSCOPY, WITH BIOPSY
Anesthesia: General | Site: Bladder

## 2024-09-10 MED ORDER — CHLORHEXIDINE GLUCONATE 0.12 % MT SOLN
15.0000 mL | Freq: Once | OROMUCOSAL | Status: AC
  Administered 2024-09-10: 15 mL via OROMUCOSAL
  Filled 2024-09-10: qty 15

## 2024-09-10 MED ORDER — PROPOFOL 10 MG/ML IV BOLUS
INTRAVENOUS | Status: DC | PRN
Start: 2024-09-10 — End: 2024-09-10
  Administered 2024-09-10: 30 mg via INTRAVENOUS
  Administered 2024-09-10: 150 mg via INTRAVENOUS

## 2024-09-10 MED ORDER — OXYCODONE HCL 5 MG PO TABS: 5.0000 mg | ORAL_TABLET | Freq: Once | ORAL | Status: DC | PRN

## 2024-09-10 MED ORDER — LIDOCAINE 2% (20 MG/ML) 5 ML SYRINGE
INTRAMUSCULAR | Status: AC
  Filled 2024-09-10: qty 5

## 2024-09-10 MED ORDER — PHENYLEPHRINE 80 MCG/ML (10ML) SYRINGE FOR IV PUSH (FOR BLOOD PRESSURE SUPPORT)
PREFILLED_SYRINGE | INTRAVENOUS | Status: AC
Start: 2024-09-10 — End: 2024-09-10
  Filled 2024-09-10: qty 10

## 2024-09-10 MED ORDER — STERILE WATER FOR IRRIGATION IR SOLN
Status: DC | PRN
  Administered 2024-09-10: 500 mL
  Administered 2024-09-10: 3000 mL

## 2024-09-10 MED ORDER — OXYCODONE HCL 5 MG/5ML PO SOLN: 5.0000 mg | Freq: Once | ORAL | Status: DC | PRN

## 2024-09-10 MED ORDER — ONDANSETRON HCL 4 MG/2ML IJ SOLN
INTRAMUSCULAR | Status: AC
Start: 2024-09-10 — End: 2024-09-10
  Filled 2024-09-10: qty 2

## 2024-09-10 MED ORDER — DEXAMETHASONE SODIUM PHOSPHATE 10 MG/ML IJ SOLN
INTRAMUSCULAR | Status: AC
  Filled 2024-09-10: qty 1

## 2024-09-10 MED ORDER — ORAL CARE MOUTH RINSE: 15.0000 mL | Freq: Once | OROMUCOSAL | Status: AC

## 2024-09-10 MED ORDER — ONDANSETRON HCL 4 MG/2ML IJ SOLN: 4.0000 mg | Freq: Once | INTRAMUSCULAR | Status: DC | PRN

## 2024-09-10 MED ORDER — LACTATED RINGERS IV SOLN
INTRAVENOUS | Status: DC
Start: 2024-09-10 — End: 2024-09-10

## 2024-09-10 MED ORDER — FENTANYL CITRATE (PF) 100 MCG/2ML IJ SOLN
INTRAMUSCULAR | Status: DC | PRN
  Administered 2024-09-10 (×2): 25 ug via INTRAVENOUS
  Administered 2024-09-10: 50 ug via INTRAVENOUS

## 2024-09-10 MED ORDER — LIDOCAINE 2% (20 MG/ML) 5 ML SYRINGE
INTRAMUSCULAR | Status: DC | PRN
  Administered 2024-09-10: 60 mg via INTRAVENOUS

## 2024-09-10 MED ORDER — HYDROCODONE-ACETAMINOPHEN 5-325 MG PO TABS: 1.0000 | ORAL_TABLET | Freq: Four times a day (QID) | ORAL | 0 refills | Status: AC | PRN

## 2024-09-10 MED ORDER — FENTANYL CITRATE PF 50 MCG/ML IJ SOSY: 25.0000 ug | PREFILLED_SYRINGE | INTRAMUSCULAR | Status: DC | PRN

## 2024-09-10 MED ORDER — ONDANSETRON HCL 4 MG/2ML IJ SOLN
INTRAMUSCULAR | Status: DC | PRN
Start: 2024-09-10 — End: 2024-09-10
  Administered 2024-09-10: 4 mg via INTRAVENOUS

## 2024-09-10 MED ORDER — CEFAZOLIN SODIUM-DEXTROSE 2-4 GM/100ML-% IV SOLN
2.0000 g | INTRAVENOUS | Status: AC
  Administered 2024-09-10: 2 g via INTRAVENOUS
  Filled 2024-09-10: qty 100

## 2024-09-10 MED ORDER — FENTANYL CITRATE (PF) 100 MCG/2ML IJ SOLN
INTRAMUSCULAR | Status: AC
  Filled 2024-09-10: qty 2

## 2024-09-10 MED ORDER — PROPOFOL 10 MG/ML IV BOLUS
INTRAVENOUS | Status: AC
  Filled 2024-09-10: qty 20

## 2024-09-10 MED ORDER — DEXAMETHASONE SODIUM PHOSPHATE 4 MG/ML IJ SOLN
INTRAMUSCULAR | Status: DC | PRN
  Administered 2024-09-10: 4 mg via INTRAVENOUS

## 2024-09-10 MED ORDER — DEXAMETHASONE SODIUM PHOSPHATE 10 MG/ML IJ SOLN
INTRAMUSCULAR | Status: DC | PRN
  Administered 2024-09-10: 5 mg via INTRAVENOUS

## 2024-09-10 SURGICAL SUPPLY — 19 items
BAG DRAIN URO TABLE W/ADPT NS (BAG) ×1 IMPLANT
BAG HAMPER (MISCELLANEOUS) ×1 IMPLANT
CLOTH BEACON ORANGE TIMEOUT ST (SAFETY) ×1 IMPLANT
ELECTRODE REM PT RTRN 9FT ADLT (ELECTROSURGICAL) ×1 IMPLANT
GLOVE BIO SURGEON STRL SZ8 (GLOVE) ×1 IMPLANT
GLOVE BIOGEL PI IND STRL 7.0 (GLOVE) ×2 IMPLANT
GOWN STRL REUS W/TWL LRG LVL3 (GOWN DISPOSABLE) ×1 IMPLANT
GOWN STRL REUS W/TWL XL LVL3 (GOWN DISPOSABLE) ×1 IMPLANT
KIT TURNOVER CYSTO (KITS) ×1 IMPLANT
MANIFOLD NEPTUNE II (INSTRUMENTS) ×1 IMPLANT
NDL HYPO 18GX1.5 BLUNT FILL (NEEDLE) ×1 IMPLANT
NEEDLE HYPO 18GX1.5 BLUNT FILL (NEEDLE) ×1 IMPLANT
PACK CYSTO (CUSTOM PROCEDURE TRAY) ×1 IMPLANT
PAD ARMBOARD POSITIONER FOAM (MISCELLANEOUS) ×1 IMPLANT
PAD TELFA 3X4 1S STER (GAUZE/BANDAGES/DRESSINGS) ×1 IMPLANT
POSITIONER HEAD 8X9X4 ADT (SOFTGOODS) ×1 IMPLANT
TOWEL OR 17X26 4PK STRL BLUE (TOWEL DISPOSABLE) ×1 IMPLANT
WATER STERILE IRR 3000ML UROMA (IV SOLUTION) ×1 IMPLANT
WATER STERILE IRR 500ML POUR (IV SOLUTION) ×1 IMPLANT

## 2024-09-10 NOTE — Transfer of Care (Signed)
 Immediate Anesthesia Transfer of Care Note  Patient: [name]  Procedure(s) Performed: CYSTOSCOPY, WITH BIOPSY (Bladder)  Patient Location: PACU  Anesthesia Type:General  Level of Consciousness: awake, alert , oriented, and patient cooperative  Airway & Oxygen Therapy: Patient Spontanous Breathing  Post-op Assessment: Report given to RN and Post -op Vital signs reviewed and stable  Post vital signs: Reviewed and stable  Last Vitals:  Vitals Value Taken Time  BP 138/69 09/10/24 12:57  Temp    Pulse 80 09/10/24 12:59  Resp 12 09/10/24 12:59  SpO2 98 % 09/10/24 12:59  Vitals shown include unfiled device data.  Last Pain:  Vitals:   09/10/24 0910  TempSrc: Oral  PainSc: 0-No pain      Patients Stated Pain Goal: 6 (09/10/24 0910)  Complications: No notable events documented.

## 2024-09-10 NOTE — Anesthesia Preprocedure Evaluation (Signed)
 Anesthesia Evaluation  Patient identified by MRN, date of birth, ID band Patient awake    Reviewed: Allergy & Precautions, H&P , NPO status , Patient's Chart, lab work & pertinent test results, reviewed documented beta blocker date and time   Airway Mallampati: II  TM Distance: >3 FB Neck ROM: full    Dental no notable dental hx.    Pulmonary neg pulmonary ROS   Pulmonary exam normal breath sounds clear to auscultation       Cardiovascular Exercise Tolerance: Good hypertension, negative cardio ROS  Rhythm:regular Rate:Normal     Neuro/Psych negative neurological ROS  negative psych ROS   GI/Hepatic Neg liver ROS,GERD  ,,  Endo/Other  negative endocrine ROS    Renal/GU negative Renal ROS  negative genitourinary   Musculoskeletal   Abdominal   Peds  Hematology negative hematology ROS (+)   Anesthesia Other Findings   Reproductive/Obstetrics negative OB ROS                             Anesthesia Physical Anesthesia Plan  ASA: 2  Anesthesia Plan: General and General LMA   Post-op Pain Management:    Induction:   PONV Risk Score and Plan: Ondansetron   Airway Management Planned:   Additional Equipment:   Intra-op Plan:   Post-operative Plan:   Informed Consent: I have reviewed the patients History and Physical, chart, labs and discussed the procedure including the risks, benefits and alternatives for the proposed anesthesia with the patient or authorized representative who has indicated his/her understanding and acceptance.     Dental Advisory Given  Plan Discussed with: CRNA  Anesthesia Plan Comments:        Anesthesia Quick Evaluation

## 2024-09-10 NOTE — Op Note (Signed)
 Preoperative diagnosis: bladder mass  Postoperative diagnosis: Same  Procedure: 1 cystoscopy 2. Bladder biopsy with fulgeration  Attending: Belvie Clara  Anesthesia: General  Estimated blood loss: Minimal  Drains: none  Specimens: Bladder biopsies x 2  Antibiotics: ancef   Findings:  Ureteral orifices in normal anatomic location.erythematous left posterior wall lesion  Indications: Patient is a 68 year old female with a history of gross hematuria and a bladder lesion found on office cystoscopy.  After discussing treatment options, they decided proceed with bladder biopsy.  Procedure in detail: The patient was brought to the operating room and a brief timeout was done to ensure correct patient, correct procedure, correct site.  General anesthesia was administered patient was placed in dorsal lithotomy position.  Their genitalia was then prepped and draped in usual sterile fashion.  A rigid 22 French cystoscope was passed in the urethra and the bladder.  We noted a 2cm posterior wall erythematous lesion We proceeded to obtain multiple biopsies from the posterior wall where there were areas of erythema. Hemostasis was then obtained with a bugbee. the bladder was then drained and this concluded the procedure which was well tolerated by patient.  Complications: None  Condition: Stable, extubated, transferred to PACU  Plan: Patient will be discharged home and will followup in 7 days for a pathology discussion

## 2024-09-10 NOTE — H&P (Signed)
 Urology Admission H&P  Chief Complaint: Bladder mass  History of Present Illness: [name] is a [phone] here for bladder biopsy for a bladder mass and gross hematuria.  Past Medical History:  Diagnosis Date   Breast cancer (HCC) 2010   Right Breast Cancer   Cancer [facility])    Chronic urinary tract infection    GERD (gastroesophageal reflux disease)    HSV-1 infection    HSV-2 infection 2020   Past Surgical History:  Procedure Laterality Date   ABDOMINAL HYSTERECTOMY  August 2010   BREAST LUMPECTOMY Right 2010   BREAST SURGERY  2010   Breast cancer   COLONOSCOPY N/A 11/07/2014   Procedure: COLONOSCOPY;  Surgeon: Claudis RAYMOND Rivet, MD;  [facility];  Service: Endoscopy;  Laterality: N/A;  1200   CYSTOSCOPY WITH BIOPSY N/A 10/12/2021   Procedure: CYSTOSCOPY WITH BIOPSY AND FULGERATION;  Surgeon: Sherrilee Belvie CROME, MD;  Location: [facility];  Service: Urology;  Laterality: N/A;   CYSTOSCOPY WITH LITHOLAPAXY N/A 10/12/2021   Procedure: CYSTOSCOPY WITH LITHOLAPAXY;  Surgeon: Sherrilee Belvie CROME, MD;  Location: [facility];  Service: Urology;  Laterality: N/A;   ESOPHAGOGASTRODUODENOSCOPY N/A 09/18/2018   Procedure: ESOPHAGOGASTRODUODENOSCOPY (EGD);  Surgeon: Rivet Claudis RAYMOND, MD;  Location[facility];  Service: Endoscopy;  Laterality: N/A;  12:25    Home Medications:  Current Facility-Administered Medications  Medication Dose Route Frequency Provider Last Rate Last Admin   ceFAZolin  (ANCEF ) IVPB 2g/100 mL premix  2 g Intravenous 30 min Pre-Op Lynett Brasil, Belvie CROME, MD       lactated ringers  infusion   Intravenous Continuous Kendell Yvonna PARAS, MD 10 mL/hr at 09/10/24 0922 New Bag at 09/10/24 9077   Allergies:  Allergies  Allergen Reactions   Pantoprazole      Patient states that it gave her headaches.   Macrobid [Nitrofurantoin Macrocrystal]     Breathing problems. 02 level dropped   Nitrofurantoin     Family History  Problem Relation Age of Onset   Breast cancer Mother 37    Asthma Mother    Lung cancer Father    Bladder Cancer Brother    Bladder Cancer Maternal Grandfather    Lung cancer Brother    Social History:  reports that she has never smoked. She has never used smokeless tobacco. She reports that she does not drink alcohol and does not use drugs.  Review of Systems  All other systems reviewed and are negative.   Physical Exam:  Vital signs in last 24 hours: Temp:  [97.7 F (36.5 C)] 97.7 F (36.5 C) (09/15 0910) Pulse Rate:  [77] 77 (09/15 0910) Resp:  [16] 16 (09/15 0910) BP: (158)/(74) 158/74 (09/15 0910) SpO2:  [100 %] 100 % (09/15 0910) Weight:  [66.7 kg] 66.7 kg (09/15 0910) Physical Exam Vitals reviewed.  Constitutional:      Appearance: Normal appearance.  HENT:     Head: Normocephalic and atraumatic.     Mouth/Throat:     Mouth: Mucous membranes are dry.  Eyes:     Extraocular Movements: Extraocular movements intact.     Pupils: Pupils are equal, round, and reactive to light.  Cardiovascular:     Rate and Rhythm: Normal rate and regular rhythm.  Pulmonary:     Effort: Pulmonary effort is normal. No respiratory distress.  Abdominal:     General: Abdomen is flat. There is no distension.  Musculoskeletal:        General: No swelling. Normal range of motion.  Cervical back: Normal range of motion and neck supple.  Skin:    General: Skin is warm and dry.  Neurological:     General: No focal deficit present.     Mental Status: She is alert and oriented to person, place, and time.  Psychiatric:        Mood and Affect: Mood normal.        Behavior: Behavior normal.        Thought Content: Thought content normal.        Judgment: Judgment normal.     Laboratory Data:  No results found for this or any previous visit (from the past 24 hours). No results found for this or any previous visit (from the past 240 hours). Creatinine: No results for input(s): CREATININE in the last 168 hours. Baseline Creatinine:    Impression/Assessment:  68yo with a bladder mass  Plan:  The risks/benefits/alternatives to bladder biopsy with fulgeration was explained to the patient and she understands and wishes to proceed with surgery  Belvie Clara 09/10/2024, 12:07 PM

## 2024-09-10 NOTE — Anesthesia Procedure Notes (Signed)
 Procedure Name: LMA Insertion Date/Time: 09/10/2024 12:26 PM  Performed by: Pheobe Adine CROME, CRNAPre-anesthesia Checklist: Patient identified, Emergency Drugs available, Suction available, Patient being monitored and Timeout performed Patient Re-evaluated:Patient Re-evaluated prior to induction Oxygen Delivery Method: Circle system utilized Preoxygenation: Pre-oxygenation with 100% oxygen Induction Type: IV induction LMA: LMA inserted LMA Size: 4.0 Number of attempts: 1 Placement Confirmation: positive ETCO2, breath sounds checked- equal and bilateral and CO2 detector Tube secured with: Tape Dental Injury: Teeth and Oropharynx as per pre-operative assessment

## 2024-09-11 ENCOUNTER — Encounter (HOSPITAL_COMMUNITY): Payer: Self-pay | Admitting: Urology

## 2024-09-11 ENCOUNTER — Ambulatory Visit

## 2024-09-11 LAB — SURGICAL PATHOLOGY

## 2024-09-12 ENCOUNTER — Telehealth: Payer: Self-pay

## 2024-09-12 ENCOUNTER — Ambulatory Visit

## 2024-09-12 DIAGNOSIS — N390 Urinary tract infection, site not specified: Secondary | ICD-10-CM

## 2024-09-12 DIAGNOSIS — N329 Bladder disorder, unspecified: Secondary | ICD-10-CM

## 2024-09-12 MED ORDER — DOXYCYCLINE HYCLATE 100 MG PO CAPS: 100.0000 mg | ORAL_CAPSULE | Freq: Two times a day (BID) | ORAL | 0 refills | Status: AC

## 2024-09-12 NOTE — Anesthesia Postprocedure Evaluation (Signed)
 Anesthesia Post Note  Patient: [name]  Procedure(s) Performed: CYSTOSCOPY, WITH BIOPSY (Bladder)  Patient location during evaluation: Phase II Anesthesia Type: General Level of consciousness: awake Pain management: pain level controlled Vital Signs Assessment: post-procedure vital signs reviewed and stable Respiratory status: spontaneous breathing and respiratory function stable Cardiovascular status: blood pressure returned to baseline and stable Postop Assessment: no headache and no apparent nausea or vomiting Anesthetic complications: no Comments: Late entry   No notable events documented.   Last Vitals:  Vitals:   09/10/24 1325 09/10/24 1333  BP: 137/60 (!) 153/78  Pulse: 61 (!) 55  Resp: 14 14  Temp:  36.6 C  SpO2: 98% 99%    Last Pain:  Vitals:   09/10/24 1333  TempSrc: Oral  PainSc: 0-No pain                 [name]

## 2024-09-12 NOTE — Telephone Encounter (Signed)
 Pt called to get pathology results per verbal from MD Mckenzie lesion is begin but is positive for infection pt needs doxycycline  100mg  BID for 28 days pt voiced her understanding and confirmed listed pharmacy

## 2024-09-18 ENCOUNTER — Ambulatory Visit: Admitting: Urology

## 2024-09-25 ENCOUNTER — Ambulatory Visit
Admission: RE | Admit: 2024-09-25 | Discharge: 2024-09-25 | Disposition: A | Discharge status: Home / Self Care | Source: Ambulatory Visit | Attending: Urology | Admitting: Urology

## 2024-09-25 DIAGNOSIS — Z1231 Encounter for screening mammogram for malignant neoplasm of breast: Secondary | ICD-10-CM

## 2024-09-28 ENCOUNTER — Encounter (INDEPENDENT_AMBULATORY_CARE_PROVIDER_SITE_OTHER): Payer: Self-pay | Admitting: *Deleted

## 2024-10-01 ENCOUNTER — Ambulatory Visit (HOSPITAL_COMMUNITY)
Admission: RE | Admit: 2024-10-01 | Discharge: 2024-10-01 | Disposition: A | Discharge status: Home / Self Care | Source: Ambulatory Visit | Attending: Urology | Admitting: Urology

## 2024-10-01 DIAGNOSIS — N200 Calculus of kidney: Secondary | ICD-10-CM | POA: Diagnosis present

## 2024-10-05 ENCOUNTER — Other Ambulatory Visit: Payer: Self-pay

## 2024-10-05 ENCOUNTER — Telehealth: Payer: Self-pay

## 2024-10-05 DIAGNOSIS — N200 Calculus of kidney: Secondary | ICD-10-CM

## 2024-10-05 NOTE — Telephone Encounter (Signed)
 Return call from vm regarding 6 MM stone seen on the CT in the left kidney. Ct was routed to MD, McKenzie for review. Tried calling pt with no answer , left vm for return call.

## 2024-10-08 NOTE — Telephone Encounter (Signed)
 Pt's return call and was made aware the CT scan was routed to MD, McKenzie  to review. Voiced understanding

## 2024-10-10 ENCOUNTER — Encounter (INDEPENDENT_AMBULATORY_CARE_PROVIDER_SITE_OTHER): Payer: Self-pay | Admitting: Gastroenterology

## 2024-10-19 ENCOUNTER — Ambulatory Visit: Admitting: Urology

## 2024-10-19 ENCOUNTER — Ambulatory Visit (HOSPITAL_COMMUNITY)
Admission: RE | Admit: 2024-10-19 | Discharge: 2024-10-19 | Disposition: A | Discharge status: Home / Self Care | Source: Ambulatory Visit | Attending: Urology | Admitting: Urology

## 2024-10-19 VITALS — BP 158/83 | HR 87

## 2024-10-19 DIAGNOSIS — Z87440 Personal history of urinary (tract) infections: Secondary | ICD-10-CM

## 2024-10-19 DIAGNOSIS — N200 Calculus of kidney: Secondary | ICD-10-CM | POA: Diagnosis present

## 2024-10-19 DIAGNOSIS — N390 Urinary tract infection, site not specified: Secondary | ICD-10-CM

## 2024-10-19 LAB — URINALYSIS, ROUTINE W REFLEX MICROSCOPIC
Bilirubin, UA: NEGATIVE
Glucose, UA: NEGATIVE
Ketones, UA: NEGATIVE
Nitrite, UA: POSITIVE — AB
Specific Gravity, UA: 1.01 (ref 1.005–1.030)
Urobilinogen, Ur: 0.2 mg/dL (ref 0.2–1.0)
pH, UA: 7 (ref 5.0–7.5)

## 2024-10-19 LAB — MICROSCOPIC EXAMINATION
RBC, Urine: >30 /HPF — AB (ref 0–2)
WBC, UA: >30 /HPF — AB (ref 0–5)

## 2024-10-19 NOTE — Progress Notes (Signed)
 10/19/2024 12:38 PM   [name] June 28, 1956 [id number]  Referring provider: [facility] [address] [place],  [place] [address]  Followup frequent UTI   HPI: [name] is a 68yo here for followup for frequent UTI. She finished a 28 day course of doxycycline . She denies any dysuria. She has occasional urinary urgency and urge incontinence. Renal US  shows a possible 6mm renal calculus. She denies any flank pain.    PMH: Past Medical History:  Diagnosis Date   Breast cancer (HCC) 2010   Right Breast Cancer   Cancer [facility])    Chronic urinary tract infection    GERD (gastroesophageal reflux disease)    HSV-1 infection    HSV-2 infection 2020    Surgical History: Past Surgical History:  Procedure Laterality Date   ABDOMINAL HYSTERECTOMY  August 2010   BREAST LUMPECTOMY Right 2010   BREAST SURGERY  2010   Breast cancer   COLONOSCOPY N/A 11/07/2014   Procedure: COLONOSCOPY;  Surgeon: [name] Rivet, MD;  [facility];  Service: Endoscopy;  Laterality: N/A;  1200   CYSTOSCOPY WITH BIOPSY N/A 10/12/2021   Procedure: CYSTOSCOPY WITH BIOPSY AND FULGERATION;  Surgeon: Judith Belvie CROME, MD;  Location: [facility];  Service: Urology;  Laterality: N/A;   CYSTOSCOPY WITH BIOPSY N/A 09/10/2024   Procedure: CYSTOSCOPY, WITH BIOPSY;  Surgeon: Judith Belvie CROME, MD;  Location: [facility];  Service: Urology;  Laterality: N/A;   CYSTOSCOPY WITH LITHOLAPAXY N/A 10/12/2021   Procedure: CYSTOSCOPY WITH LITHOLAPAXY;  Surgeon: Judith Belvie CROME, MD;  Location: [facility];  Service: Urology;  Laterality: N/A;   ESOPHAGOGASTRODUODENOSCOPY N/A 09/18/2018   Procedure: ESOPHAGOGASTRODUODENOSCOPY (EGD);  Surgeon: Bowers Judith RAYMOND, MD;  [facility];  Service: Endoscopy;  Laterality: N/A;  12:25    Home Medications:  Allergies as of 10/19/2024       Reactions   Pantoprazole     Patient states that it gave her headaches.   Macrobid [nitrofurantoin  Macrocrystal]    Breathing problems. 02 level dropped   Nitrofurantoin         Medication List        Accurate as of October 19, 2024 12:38 PM. If you have any questions, ask your nurse or doctor.          BEET ROOT PO Take 1 Dose by mouth daily.   PUMPKIN SEED OIL PO Take 1 Dose by mouth daily.   D3 + K2 PO Take 1 tablet by mouth daily.   doxycycline  100 MG capsule Commonly known as: VIBRAMYCIN  Take 1 capsule (100 mg total) by mouth every 12 (twelve) hours.   FISH OIL PO Take 1 Dose by mouth daily.   MAGNESIUM PO Take 1 Dose by mouth daily.   OVER THE COUNTER MEDICATION Take 2 Doses by mouth daily. Candida Cleanse        Allergies:  Allergies  Allergen Reactions   Pantoprazole      Patient states that it gave her headaches.   Macrobid [Nitrofurantoin Macrocrystal]     Breathing problems. 02 level dropped   Nitrofurantoin     Family History: Family History  Problem Relation Age of Onset   Breast cancer Mother 43   Asthma Mother    Lung cancer Father    Bladder Cancer Brother    Bladder Cancer Maternal Grandfather    Lung cancer Brother     Social History:  reports that she has never smoked. She has never used smokeless  tobacco. She reports that she does not drink alcohol and does not use drugs.  ROS: All other review of systems were reviewed and are negative except what is noted above in HPI  Physical Exam: BP (!) 158/83   Pulse 87   Constitutional:  Alert and oriented, No acute distress. HEENT: [place] AT, moist mucus membranes.  Trachea midline, no masses. Cardiovascular: No clubbing, cyanosis, or edema. Respiratory: Normal respiratory effort, no increased work of breathing. GI: Abdomen is soft, nontender, nondistended, no abdominal masses GU: No CVA tenderness.  Lymph: No cervical or inguinal lymphadenopathy. Skin: No rashes, bruises or suspicious lesions. Neurologic: Grossly intact, no focal deficits, moving all 4  extremities. Psychiatric: Normal mood and affect.  Laboratory Data: Lab Results  Component Value Date   WBC 7.8 05/13/2018   HGB 13.1 05/13/2018   HCT 40.0 05/13/2018   MCV 92.8 05/13/2018   PLT 196 05/13/2018    Lab Results  Component Value Date   CREATININE 0.60 09/14/2021    No results found for: PSA  No results found for: TESTOSTERONE  No results found for: HGBA1C  Urinalysis    Component Value Date/Time   APPEARANCEUR Clear 07/18/2024 1437   GLUCOSEU Negative 07/18/2024 1437   BILIRUBINUR Negative 07/18/2024 1437   PROTEINUR Trace 07/18/2024 1437   NITRITE Negative 07/18/2024 1437   LEUKOCYTESUR 3+ (A) 07/18/2024 1437    Lab Results  Component Value Date   LABMICR See below: 07/18/2024   WBCUA >30 (A) 07/18/2024   LABEPIT 0-10 07/18/2024   MUCUS Present 09/30/2021   BACTERIA Moderate (A) 07/18/2024    Pertinent Imaging: Renal US  10/01/2024: Images reviewed and dicussed with the patient  No results found for this or any previous visit.  No results found for this or any previous visit.  No results found for this or any previous visit.  No results found for this or any previous visit.  Results for orders placed during the hospital encounter of 10/01/24  Ultrasound renal complete  Narrative CLINICAL DATA:  Nephrolithiasis.  Bladder calculi.  EXAM: RENAL / URINARY TRACT ULTRASOUND COMPLETE  COMPARISON:  CT abdomen pelvis 09/14/2021  FINDINGS: Right Kidney:  Renal measurements: 11.4 x 4.1 x 5.8 cm = volume: 141 mL. Echogenicity within normal limits. No mass. Minimal pelvicaliectasis.  Left Kidney:  Renal measurements: 12.3 x 4.7 x 4.3 cm = volume: 132 mL. Echogenicity within normal limits. No mass. Minimal pelvicaliectasis. 6 mm nonobstructing calculus present in the lower pole of the LEFT kidney.  Bladder:  Bladder is normal in appearance with prevoid volume of 101 ML and postvoid residual measuring 63 ML. No bladder calculi  identified.  Other:  None.  IMPRESSION: 1. Nonspecific, minimal BILATERAL pelvicaliectasis, possibly due to mild reflux or normal anatomic variation. 2. 6 mm nonobstructing calculus seen in the lower pole of the LEFT kidney.   Electronically Signed By: Aliene Lloyd M.D. On: 10/04/2024 07:55  No results found for this or any previous visit.  Results for orders placed during the hospital encounter of 09/14/21  CT HEMATURIA WORKUP  Narrative CLINICAL DATA:  Hematuria, chronic urinary tract infections in a 68 year old female.  EXAM: CT ABDOMEN AND PELVIS WITHOUT AND WITH CONTRAST  TECHNIQUE: Multidetector CT imaging of the abdomen and pelvis was performed following the standard protocol before and following the bolus administration of intravenous contrast.  CONTRAST:  OMNIPAQUE  IOHEXOL  350 MG/ML SOLN  COMPARISON:  None  FINDINGS: Lower chest: Incidental imaging of the lung bases is unremarkable. No effusion  or sign of consolidative process.  Hepatobiliary: No focal, suspicious hepatic lesion. No pericholecystic stranding. No biliary duct dilation. Portal vein is patent.  Pancreas: Normal, without mass, inflammation or ductal dilatation.  Spleen: Normal spleen.  Adrenals/Urinary Tract: Adrenal glands are normal.  Symmetric renal enhancement. No hydronephrosis. Punctate calculus in the interpolar LEFT kidney. Dense material at the bladder base likely multiple small calculi but not well characterized. No gross lesion in the urinary bladder though bladder distension limits assessment as the bladder is under distended on the current study. No suspicious renal lesion. No upper tract lesion on excretory images.  Stomach/Bowel: No acute gastrointestinal process. Appendix is normal.  Vascular/Lymphatic:  Aortic atherosclerosis. No sign of aneurysm. Smooth contour of the IVC. There is no gastrohepatic or hepatoduodenal ligament lymphadenopathy. No  retroperitoneal or mesenteric lymphadenopathy.  No pelvic sidewall lymphadenopathy.  Reproductive: Post hysterectomy.  No adnexal masses.  Other: No ascites.  Musculoskeletal: No acute or significant osseous findings.  IMPRESSION: Dense material at the bladder base likely multiple small calculi but not well characterized. No gross lesion in the urinary bladder though bladder distension limits assessment as the bladder is under distended on the current study. Consider cystoscopy not recently performed to exclude plaque-like lesion with calcification.  Punctate LEFT nephrolithiasis. No ureteral calculus. No upper tract lesion or sign of stricture.  Aortic Atherosclerosis (ICD10-I70.0).   Electronically Signed By: Isla Blind M.D. On: 09/14/2021 13:06  No results found for this or any previous visit.   Assessment & Plan:    1. Nephrolithiasis (Primary) -KUB, will call with results - Urinalysis, Routine w reflex microscopic  2. Recurrent UTI Followup 6 months with UA   No follow-ups on file.  Belvie Clara, MD  [facility] [place]

## 2024-10-22 ENCOUNTER — Telehealth: Payer: Self-pay

## 2024-10-22 ENCOUNTER — Ambulatory Visit: Payer: Self-pay

## 2024-10-22 LAB — URINE CULTURE

## 2024-10-22 MED ORDER — SULFAMETHOXAZOLE-TRIMETHOPRIM 800-160 MG PO TABS: 1.0000 | ORAL_TABLET | Freq: Two times a day (BID) | ORAL | 0 refills | Status: AC

## 2024-10-22 NOTE — Telephone Encounter (Signed)
 Patient called with no answer. Detailed message left informing patient of positive urine culture and Bactrim  DS sent to pharmacy per Dr. Sherrilee. Patient also advised to stop Macrobid.

## 2024-10-22 NOTE — Telephone Encounter (Signed)
 Pt made aware via voiced message She has a nonobstrcuting renal stone. She does not require intervention at this time

## 2024-10-22 NOTE — Telephone Encounter (Signed)
-----   Message from [name] sent at 10/16/2024  9:15 AM EDT ----- She has a nonobstrcuting renal stone. She does not require intervention at this time ----- Message ----- From: Gretta Carlos SAUNDERS, CMA Sent: 10/05/2024  11:48 AM EDT To: Judith LITTIE Clara, MD  Please review and advise

## 2024-10-23 ENCOUNTER — Telehealth: Payer: Self-pay

## 2024-10-23 ENCOUNTER — Encounter: Payer: Self-pay | Admitting: Urology

## 2024-10-23 NOTE — Telephone Encounter (Signed)
 Pt called to confirm what she had a US  for pt advised MD recommends US  for her upcoming appt to check for stones pt voiced her understanding and stated she would call [facility] prior to her appt to schedule US 

## 2024-10-23 NOTE — Patient Instructions (Signed)

## 2024-10-23 NOTE — Telephone Encounter (Signed)
 Return call to pt from voiced message with no answer. LVM for return call to office.

## 2024-12-26 ENCOUNTER — Other Ambulatory Visit: Payer: Self-pay

## 2024-12-26 ENCOUNTER — Telehealth: Payer: Self-pay

## 2024-12-26 DIAGNOSIS — N390 Urinary tract infection, site not specified: Secondary | ICD-10-CM

## 2024-12-26 NOTE — Telephone Encounter (Signed)
 Dysuria  Patient called with c/o dysuria x 2-3 days days.  Pain: burning  Severity:6/10  Associated Signs and Symptoms:  Fever: noTemp.0 Chills: no Hematuria: yes Urgency: yes Frequency: yes Hesitancy:no Incontinence: yes Nausea: no Vomiting: no  Urologic History:  Any Recent Urologic Surgeries or Procedures:no Recurrent UTI's:yes Cystitis: yes  Prostatitis:no Kidney or Bladder Stones: yes Plan: Walk-in Clinic: no Appointment w/Physician: [no Lab visit scheduled for urine drop off: Yes Advice given:  Do you take on daily medications for UTI suppression No

## 2024-12-31 ENCOUNTER — Telehealth: Payer: Self-pay

## 2024-12-31 ENCOUNTER — Other Ambulatory Visit

## 2024-12-31 DIAGNOSIS — N390 Urinary tract infection, site not specified: Secondary | ICD-10-CM

## 2024-12-31 MED ORDER — SULFAMETHOXAZOLE-TRIMETHOPRIM 400-80 MG PO TABS: 1.0000 | ORAL_TABLET | Freq: Two times a day (BID) | ORAL | 0 refills | Status: AC

## 2024-12-31 NOTE — Telephone Encounter (Signed)
 Patient presents today with complaints of  burning .  UA and Culture done today.  Dr. Nieves  reviewed results and Bactrim   .  Patient aware of MD recommendations and that we will reach out with culture results.      [email or web address], CMA    Tried calling pt with no answer

## 2024-12-31 NOTE — Telephone Encounter (Signed)
 Pt state's she has had multiple UTI since January of last year and concern about the recurrent. Pt state's she has been on multiple abt

## 2025-01-01 LAB — URINALYSIS, ROUTINE W REFLEX MICROSCOPIC
Bilirubin, UA: NEGATIVE
Glucose, UA: NEGATIVE
Ketones, UA: NEGATIVE
Nitrite, UA: POSITIVE — AB
Specific Gravity, UA: 1.015 (ref 1.005–1.030)
Urobilinogen, Ur: 0.2 mg/dL (ref 0.2–1.0)
pH, UA: 7.5 (ref 5.0–7.5)

## 2025-01-01 LAB — MICROSCOPIC EXAMINATION
RBC, Urine: >30 /HPF — AB (ref 0–2)
WBC, UA: >30 /HPF — AB (ref 0–5)

## 2025-01-01 NOTE — Telephone Encounter (Signed)
 Pt return call and was made aware of urinalysis. Pt started on Bactrim  12/31/2024

## 2025-01-02 LAB — URINE CULTURE

## 2025-01-04 ENCOUNTER — Ambulatory Visit: Payer: Self-pay

## 2025-04-19 ENCOUNTER — Ambulatory Visit: Admitting: Urology
